# Patient Record
Sex: Female | Born: 1945
Health system: Southern US, Community
[De-identification: ages and names within clinical notes are randomized; demographics above are authoritative.]

## PROBLEM LIST (undated history)

## (undated) DIAGNOSIS — J189 Pneumonia, unspecified organism: Secondary | ICD-10-CM

## (undated) DIAGNOSIS — I10 Essential (primary) hypertension: Secondary | ICD-10-CM

## (undated) DIAGNOSIS — K5790 Diverticulosis of intestine, part unspecified, without perforation or abscess without bleeding: Secondary | ICD-10-CM

## (undated) DIAGNOSIS — G8929 Other chronic pain: Secondary | ICD-10-CM

## (undated) DIAGNOSIS — N12 Tubulo-interstitial nephritis, not specified as acute or chronic: Secondary | ICD-10-CM

## (undated) DIAGNOSIS — E039 Hypothyroidism, unspecified: Secondary | ICD-10-CM

## (undated) DIAGNOSIS — M1990 Unspecified osteoarthritis, unspecified site: Secondary | ICD-10-CM

## (undated) DIAGNOSIS — M549 Dorsalgia, unspecified: Secondary | ICD-10-CM

## (undated) DIAGNOSIS — N189 Chronic kidney disease, unspecified: Secondary | ICD-10-CM

## (undated) DIAGNOSIS — K219 Gastro-esophageal reflux disease without esophagitis: Secondary | ICD-10-CM

## (undated) DIAGNOSIS — R519 Headache, unspecified: Secondary | ICD-10-CM

## (undated) DIAGNOSIS — F429 Obsessive-compulsive disorder, unspecified: Secondary | ICD-10-CM

## (undated) DIAGNOSIS — F329 Major depressive disorder, single episode, unspecified: Secondary | ICD-10-CM

## (undated) DIAGNOSIS — H269 Unspecified cataract: Secondary | ICD-10-CM

## (undated) DIAGNOSIS — E785 Hyperlipidemia, unspecified: Secondary | ICD-10-CM

## (undated) DIAGNOSIS — F32A Depression, unspecified: Secondary | ICD-10-CM

## (undated) DIAGNOSIS — R51 Headache: Secondary | ICD-10-CM

## (undated) DIAGNOSIS — F419 Anxiety disorder, unspecified: Secondary | ICD-10-CM

## (undated) DIAGNOSIS — G43909 Migraine, unspecified, not intractable, without status migrainosus: Secondary | ICD-10-CM

## (undated) HISTORY — PX: HAMMER TOE SURGERY: SHX385

## (undated) HISTORY — PX: CATARACT EXTRACTION W/ INTRAOCULAR LENS  IMPLANT, BILATERAL: SHX1307

## (undated) HISTORY — DX: Anxiety disorder, unspecified: F41.9

## (undated) HISTORY — PX: KNEE ARTHROSCOPY: SUR90

## (undated) HISTORY — DX: Unspecified cataract: H26.9

## (undated) HISTORY — DX: Hyperlipidemia, unspecified: E78.5

## (undated) HISTORY — DX: Chronic kidney disease, unspecified: N18.9

## (undated) HISTORY — DX: Gastro-esophageal reflux disease without esophagitis: K21.9

## (undated) HISTORY — PX: DILATION AND CURETTAGE OF UTERUS: SHX78

---
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
## 1984-12-22 HISTORY — PX: ABDOMINAL HYSTERECTOMY: SHX81

## 1991-12-23 HISTORY — PX: OOPHORECTOMY: SHX86

## 1999-06-28 ENCOUNTER — Emergency Department (HOSPITAL_COMMUNITY): Admission: EM | Admit: 1999-06-28 | Discharge: 1999-06-28 | Payer: Self-pay | Admitting: Emergency Medicine

## 1999-06-28 ENCOUNTER — Encounter: Payer: Self-pay | Admitting: Emergency Medicine

## 1999-09-27 ENCOUNTER — Other Ambulatory Visit: Admission: RE | Admit: 1999-09-27 | Discharge: 1999-09-27 | Payer: Self-pay | Admitting: Obstetrics and Gynecology

## 2001-03-05 ENCOUNTER — Other Ambulatory Visit: Admission: RE | Admit: 2001-03-05 | Discharge: 2001-03-05 | Payer: Self-pay | Admitting: Obstetrics and Gynecology

## 2002-06-06 ENCOUNTER — Other Ambulatory Visit: Admission: RE | Admit: 2002-06-06 | Discharge: 2002-06-06 | Payer: Self-pay | Admitting: Obstetrics and Gynecology

## 2003-06-08 ENCOUNTER — Other Ambulatory Visit: Admission: RE | Admit: 2003-06-08 | Discharge: 2003-06-08 | Payer: Self-pay | Admitting: Obstetrics and Gynecology

## 2005-01-14 ENCOUNTER — Ambulatory Visit: Payer: Self-pay | Admitting: Family Medicine

## 2005-04-18 ENCOUNTER — Ambulatory Visit: Payer: Self-pay | Admitting: Family Medicine

## 2005-04-21 ENCOUNTER — Ambulatory Visit: Payer: Self-pay | Admitting: Family Medicine

## 2005-04-29 ENCOUNTER — Ambulatory Visit: Payer: Self-pay | Admitting: Family Medicine

## 2005-07-01 ENCOUNTER — Ambulatory Visit: Payer: Self-pay | Admitting: Emergency Medicine

## 2005-09-24 ENCOUNTER — Emergency Department (HOSPITAL_COMMUNITY): Admission: EM | Admit: 2005-09-24 | Discharge: 2005-09-24 | Payer: Self-pay | Admitting: Emergency Medicine

## 2005-10-09 ENCOUNTER — Ambulatory Visit: Payer: Self-pay | Admitting: Family Medicine

## 2005-12-24 ENCOUNTER — Ambulatory Visit: Payer: Self-pay | Admitting: Internal Medicine

## 2006-09-07 ENCOUNTER — Ambulatory Visit: Payer: Self-pay | Admitting: Family Medicine

## 2006-12-24 ENCOUNTER — Ambulatory Visit: Payer: Self-pay | Admitting: Family Medicine

## 2006-12-24 LAB — CONVERTED CEMR LAB
ALT: 14 units/L (ref 0–40)
AST: 19 units/L (ref 0–37)
Albumin: 4.1 g/dL (ref 3.5–5.2)
Alkaline Phosphatase: 55 units/L (ref 39–117)
BUN: 11 mg/dL (ref 6–23)
Basophils Absolute: 0 10*3/uL (ref 0.0–0.1)
Basophils Relative: 0.7 % (ref 0.0–1.0)
CO2: 28 meq/L (ref 19–32)
Calcium: 9.2 mg/dL (ref 8.4–10.5)
Chloride: 101 meq/L (ref 96–112)
Chol/HDL Ratio, serum: 2.8
Cholesterol: 201 mg/dL (ref 0–200)
Creatinine, Ser: 0.8 mg/dL (ref 0.4–1.2)
Eosinophil percent: 0.9 % (ref 0.0–5.0)
Free T4: 1 ng/dL (ref 0.6–1.6)
GFR calc non Af Amer: 78 mL/min
Glomerular Filtration Rate, Af Am: 94 mL/min/{1.73_m2}
Glucose, Bld: 89 mg/dL (ref 70–99)
HCT: 41.7 % (ref 36.0–46.0)
HDL: 71.6 mg/dL (ref >39.0)
Hemoglobin: 14 g/dL (ref 12.0–15.0)
LDL DIRECT: 101 mg/dL
Lymphocytes Relative: 21.7 % (ref 12.0–46.0)
MCHC: 33.5 g/dL (ref 30.0–36.0)
MCV: 89.7 fL (ref 78.0–100.0)
Monocytes Absolute: 0.3 10*3/uL (ref 0.2–0.7)
Monocytes Relative: 4.3 % (ref 3.0–11.0)
Neutro Abs: 4.5 10*3/uL (ref 1.4–7.7)
Neutrophils Relative %: 72.4 % (ref 43.0–77.0)
Platelets: 317 10*3/uL (ref 150–400)
Potassium: 3.7 meq/L (ref 3.5–5.1)
RBC: 4.65 M/uL (ref 3.87–5.11)
RDW: 11.9 % (ref 11.5–14.6)
Sodium: 138 meq/L (ref 135–145)
T3, Free: 3 pg/mL (ref 2.3–4.2)
TSH: 0.16 microintl units/mL — ABNORMAL LOW (ref 0.35–5.50)
Total Bilirubin: 0.5 mg/dL (ref 0.3–1.2)
Total Protein: 6.5 g/dL (ref 6.0–8.3)
Triglyceride fasting, serum: 117 mg/dL (ref 0–149)
VLDL: 23 mg/dL (ref 0–40)
WBC: 6.2 10*3/uL (ref 4.5–10.5)

## 2008-01-20 ENCOUNTER — Encounter: Admission: RE | Admit: 2008-01-20 | Discharge: 2008-01-20 | Payer: Self-pay | Admitting: Neurosurgery

## 2009-12-22 HISTORY — PX: COLONOSCOPY: SHX174

## 2010-06-26 ENCOUNTER — Encounter (INDEPENDENT_AMBULATORY_CARE_PROVIDER_SITE_OTHER): Payer: Self-pay | Admitting: *Deleted

## 2010-08-08 ENCOUNTER — Encounter (INDEPENDENT_AMBULATORY_CARE_PROVIDER_SITE_OTHER): Payer: Self-pay | Admitting: *Deleted

## 2010-08-13 ENCOUNTER — Ambulatory Visit: Payer: Self-pay | Admitting: Gastroenterology

## 2010-08-20 ENCOUNTER — Ambulatory Visit: Payer: Self-pay | Admitting: Gastroenterology

## 2010-12-22 HISTORY — PX: BUNIONECTOMY WITH HAMMERTOE RECONSTRUCTION: SHX5600

## 2011-01-21 NOTE — Miscellaneous (Signed)
Summary: [facility] Previsit/prep  Clinical Lists Changes  Medications: Added new medication of MOVIPREP 100 GM  SOLR (PEG-KCL-NACL-NASULF-NA ASC-C) As per prep instructions. - Signed Rx of MOVIPREP 100 GM  SOLR (PEG-KCL-NACL-NASULF-NA ASC-C) As per prep instructions.;  #1 x 0;  Signed;  Entered by: Wyona Almas RN;  Authorized by: Meryl Dare MD [facility];  Method used: [facility] [id number]*, [address], [place], [place], [place]  [address], Ph: [phone], Fax: [phone] Allergies: Added new allergy or adverse reaction of SULFA Added new allergy or adverse reaction of ASA Observations: Added new observation of NKA: F (08/13/2010 12:43)    Prescriptions: MOVIPREP 100 GM  SOLR (PEG-KCL-NACL-NASULF-NA ASC-C) As per prep instructions.  #1 x 0   Entered by:   Wyona Almas RN   Authorized by:   Meryl Dare MD [facility]   Signed by:   Wyona Almas RN on 08/13/2010   Method used:   Electronically to        [facility] [phone]* (retail)       [address]       [place], [place]  [address]       Ph: [phone]       Fax: [phone]   RxID:   [id number]

## 2011-01-21 NOTE — Letter (Signed)
Summary: [facility] Instructions  [facility]  [address] [place], [place] [address]   Phone: [phone]  Fax: [phone]       [name]    Aug 17, 1946    MRN: [id number]        Procedure Day Dorna Bloom:  Jake Shark  08/20/10     Arrival Time:  7:30AM     Procedure Time:  8:30AM     Location of Procedure:                    _ X_  [facility] (4th Floor)                 PREPARATION FOR COLONOSCOPY WITH MOVIPREP   Starting 5 days prior to your procedure 08/15/10 do not eat nuts, seeds, popcorn, corn, beans, peas,  salads, or any raw vegetables.  Do not take any fiber supplements (e.g. Metamucil, Citrucel, and Benefiber).  THE DAY BEFORE YOUR PROCEDURE         DATE: 08/19/10  DAY: MONDAY  1.  Drink clear liquids the entire day-NO SOLID FOOD  2.  Do not drink anything colored red or purple.  Avoid juices with pulp.  No orange juice.  3.  Drink at least 64 oz. (8 glasses) of fluid/clear liquids during the day to prevent dehydration and help the prep work efficiently.  CLEAR LIQUIDS INCLUDE: Water Jello Ice Popsicles Tea (sugar ok, no milk/cream) Powdered fruit flavored drinks Coffee (sugar ok, no milk/cream) Gatorade Juice: apple, white grape, white cranberry  Lemonade Clear bullion, consomm, broth Carbonated beverages (any kind) Strained chicken noodle soup Hard Candy                             4.  In the morning, mix first dose of MoviPrep solution:    Empty 1 Pouch A and 1 Pouch B into the disposable container    Add lukewarm drinking water to the top line of the container. Mix to dissolve    Refrigerate (mixed solution should be used within 24 hrs)  5.  Begin drinking the prep at 5:00 p.m. The MoviPrep container is divided by 4 marks.   Every 15 minutes drink the solution down to the next mark (approximately 8 oz) until the full liter is complete.   6.  Follow completed prep with 16 oz of clear liquid of your choice (Nothing red or  purple).  Continue to drink clear liquids until bedtime.  7.  Before going to bed, mix second dose of MoviPrep solution:    Empty 1 Pouch A and 1 Pouch B into the disposable container    Add lukewarm drinking water to the top line of the container. Mix to dissolve    Refrigerate  THE DAY OF YOUR PROCEDURE      DATE: 08/20/10  DAY: TUESDAY  Beginning at 3:30AM (5 hours before procedure):         1. Every 15 minutes, drink the solution down to the next mark (approx 8 oz) until the full liter is complete.  2. Follow completed prep with 16 oz. of clear liquid of your choice.    3. You may drink clear liquids until 6:30AM (2 HOURS BEFORE PROCEDURE).   MEDICATION INSTRUCTIONS  Unless otherwise instructed, you should take regular prescription medications with a small sip of water   as early as possible the morning of your procedure.  OTHER INSTRUCTIONS  You will need a responsible adult at least 65 years of age to accompany you and drive you home.   This person must remain in the waiting room during your procedure.  Wear loose fitting clothing that is easily removed.  Leave jewelry and other valuables at home.  However, you may wish to bring a book to read or  an iPod/MP3 player to listen to music as you wait for your procedure to start.  Remove all body piercing jewelry and leave at home.  Total time from sign-in until discharge is approximately 2-3 hours.  You should go home directly after your procedure and rest.  You can resume normal activities the  day after your procedure.  The day of your procedure you should not:   Drive   Make legal decisions   Operate machinery   Drink alcohol   Return to work  You will receive specific instructions about eating, activities and medications before you leave.    The above instructions have been reviewed and explained to me by  Wyona Almas RN  August 13, 2010 1:18 PM     I fully understand and can verbalize  these instructions _____________________________ Date _________

## 2011-01-21 NOTE — Letter (Signed)
Summary: Previsit letter  [facility]  [address] [place], [place] [address]   Phone: [phone]  Fax: [phone]       06/26/2010 MRN: [id number]  [name] [address] [place], [place]  [address]  Dear [name],  Welcome to the Gastroenterology Division at [facility].    You are scheduled to see a nurse for your pre-procedure visit on 08/13/2010 at 1:00pm on the 3rd floor at [facility], [address]  We ask that you try to arrive at our office 15 minutes prior to your appointment time to allow for check-in.  Your nurse visit will consist of discussing your medical and surgical history, your immediate family medical history, and your medications.    Please bring a complete list of all your medications or, if you prefer, bring the medication bottles and we will list them.  We will need to be aware of both prescribed and over the counter drugs.  We will need to know exact dosage information as well.  If you are on blood thinners (Coumadin, Plavix, Aggrenox, Ticlid, etc.) please call our office today/prior to your appointment, as we need to consult with your physician about holding your medication.   Please be prepared to read and sign documents such as consent forms, a financial agreement, and acknowledgement forms.  If necessary, and with your consent, a friend or relative is welcome to sit-in on the nurse visit with you.  Please bring your insurance card so that we may make a copy of it.  If your insurance requires a referral to see a specialist, please bring your referral form from your primary care physician.  No co-pay is required for this nurse visit.     If you cannot keep your appointment, please call [phone] to cancel or reschedule prior to your appointment date.  This allows [place] the opportunity to schedule an appointment for another patient in need of care.    Thank you for choosing [facility] for your medical needs.   We appreciate the opportunity to care for you.  Please visit [place] at our website  to learn more about our practice.                     Sincerely.                                                                                                                   The Gastroenterology Division

## 2011-01-21 NOTE — Procedures (Signed)
Summary: Colonoscopy  Patient: [name] Note: All result statuses are Final unless otherwise noted.  Tests: (1) Colonoscopy (COL)   COL Colonoscopy           DONE     [facility]     [address]     [place], [place]  [address]           COLONOSCOPY PROCEDURE REPORT     PATIENT:  [name], [name]  MR#:  [id number]     BIRTHDATE:  1946-02-15, 64 yrs. old  GENDER:  female     ENDOSCOPIST:  Judie Petit T. Russella Dar, MD, [facility]           PROCEDURE DATE:  08/20/2010     PROCEDURE:  Colonoscopy [address]     ASA CLASS:  Class II     INDICATIONS:  1) Routine Risk Screening     MEDICATIONS:   Fentanyl 100 mcg IV, Versed 8 mg IV     DESCRIPTION OF PROCEDURE:   After the risks benefits and     alternatives of the procedure were thoroughly explained, informed     consent was obtained.  Digital rectal exam was performed and     revealed no abnormalities.   The LB [id number]-[id number] endoscope     was introduced through the anus and advanced to the cecum, which     was identified by both the appendix and ileocecal valve, without     limitations.  The quality of the prep was good, using MoviPrep.     The instrument was then slowly withdrawn as the colon was fully     examined.     <<PROCEDUREIMAGES>>     FINDINGS:  Moderate diverticulosis was found in the sigmoid to     descending colon. A normal appearing cecum, ileocecal valve, and     appendiceal orifice were identified. The ascending, hepatic     flexure, transverse, splenic flexure, and rectum appeared     unremarkable. Retroflexed views in the rectum revealed no     abnormalities. The time to cecum =  4.5  minutes. The scope was     then withdrawn (time =  8.5  min) from the patient and the     procedure completed.     COMPLICATIONS:  None           ENDOSCOPIC IMPRESSION:     1) Moderate diverticulosis in the sigmoid to descending colon     segments     RECOMMENDATIONS:     1) High fiber diet with liberal fluid intake.     2)  Continue current colorectal screening recommendations for     "routine risk" patients with a repeat colonoscopy in 10 years.           Venita Lick. Russella Dar, MD, [facility]           CC: Laurann Montana, MD           n.     Rosalie DoctorVenita Lick. Stark at 08/20/2010 09:03 AM           Cathlyn Parsons, [id number]  Note: An exclamation mark (!) indicates a result that was not dispersed into the flowsheet. Document Creation Date: 08/20/2010 9:05 AM _______________________________________________________________________  (1) Order result status: Final Collection or observation date-time: 08/20/2010 08:55 Requested date-time:  Receipt date-time:  Reported date-time:  Referring Physician:   Ordering Physician: Claudette Head [phone]) Specimen Source:  Source: Launa Grill Order Number: [id number] Lab site:  Appended Document: Colonoscopy    Clinical Lists Changes  Observations: Added new observation of COLONNXTDUE: 07/2020 (08/20/2010 12:19)

## 2012-05-03 ENCOUNTER — Other Ambulatory Visit: Payer: Self-pay | Admitting: Family Medicine

## 2012-05-12 ENCOUNTER — Encounter (HOSPITAL_COMMUNITY)
Admission: RE | Admit: 2012-05-12 | Discharge: 2012-05-12 | Disposition: A | Discharge status: Home / Self Care | Payer: Medicare Other | Source: Ambulatory Visit | Attending: Family Medicine | Admitting: Family Medicine

## 2012-05-12 ENCOUNTER — Encounter (HOSPITAL_COMMUNITY): Payer: Self-pay

## 2012-05-12 DIAGNOSIS — M810 Age-related osteoporosis without current pathological fracture: Secondary | ICD-10-CM | POA: Insufficient documentation

## 2012-05-12 HISTORY — DX: Diverticulosis of intestine, part unspecified, without perforation or abscess without bleeding: K57.90

## 2012-05-12 HISTORY — DX: Unspecified osteoarthritis, unspecified site: M19.90

## 2012-05-12 HISTORY — DX: Hypothyroidism, unspecified: E03.9

## 2012-05-12 HISTORY — DX: Obsessive-compulsive disorder, unspecified: F42.9

## 2012-05-12 MED ORDER — SODIUM CHLORIDE 0.9 % IV SOLN
Freq: Once | INTRAVENOUS | Status: AC
  Administered 2012-05-12: 15:00:00 via INTRAVENOUS

## 2012-05-12 MED ORDER — ZOLEDRONIC ACID 5 MG/100ML IV SOLN
5.0000 mg | Freq: Once | INTRAVENOUS | Status: AC
  Administered 2012-05-12: 5 mg via INTRAVENOUS
  Filled 2012-05-12: qty 100

## 2012-05-12 NOTE — Discharge Instructions (Signed)
Drink plenty of water the next few days. If any aches and pains take a pain reliever you normally would take. Continue taking your Calcium and Vitamin D as prescribed by your MD.     RECLAST Zoledronic Acid injection (Paget's Disease, Osteoporosis)  What is this medicine?  ZOLEDRONIC ACID (ZOE le dron ik AS id) lowers the amount of calcium loss from bone. It is used to treat Paget's disease and osteoporosis in women.  This medicine may be used for other purposes; ask your health care provider or pharmacist if you have questions.  What should I tell my health care provider before I take this medicine?  They need to know if you have any of these conditions:  -aspirin-sensitive asthma  -dental disease  -kidney disease  -low levels of calcium in the blood  -past surgery on the parathyroid gland or intestines  -an unusual or allergic reaction to zoledronic acid, other medicines, foods, dyes, or preservatives  -pregnant or trying to get pregnant  -breast-feeding  How should I use this medicine?  This medicine is for infusion into a vein. It is given by a health care professional in a hospital or clinic setting.  Talk to your pediatrician regarding the use of this medicine in children. This medicine is not approved for use in children.  Overdosage: If you think you have taken too much of this medicine contact a poison control center or emergency room at once.  NOTE: This medicine is only for you. Do not share this medicine with others.  What if I miss a dose?  It is important not to miss your dose. Call your doctor or health care professional if you are unable to keep an appointment.  What may interact with this medicine?  -certain antibiotics given by injection  -NSAIDs, medicines for pain and inflammation, like ibuprofen or naproxen  -some diuretics like bumetanide, furosemide  -teriparatide  This list may not describe all possible interactions. Give your health care provider a list of all  the medicines, herbs, non-prescription drugs, or dietary supplements you use. Also tell them if you smoke, drink alcohol, or use illegal drugs. Some items may interact with your medicine.  What should I watch for while using this medicine?  Visit your doctor or health care professional for regular checkups. It may be some time before you see the benefit from this medicine. Do not stop taking your medicine unless your doctor tells you to. Your doctor may order blood tests or other tests to see how you are doing.  Women should inform their doctor if they wish to become pregnant or think they might be pregnant. There is a potential for serious side effects to an unborn child. Talk to your health care professional or pharmacist for more information.  You should make sure that you get enough calcium and vitamin D while you are taking this medicine. Discuss the foods you eat and the vitamins you take with your health care professional.  Some people who take this medicine have severe bone, joint, and/or muscle pain. This medicine may also increase your risk for a broken thigh bone. Tell your doctor right away if you have pain in your upper leg or groin. Tell your doctor if you have any pain that does not go away or that gets worse.  What side effects may I notice from receiving this medicine?  Side effects that you should report to your doctor or health care professional as soon as possible:  -allergic reactions  like skin rash, itching or hives, swelling of the face, lips, or tongue  -breathing problems  -changes in vision  -feeling faint or lightheaded, falls  -jaw burning, cramping, or pain  -muscle cramps, stiffness, or weakness  -trouble passing urine or change in the amount of urine  Side effects that usually do not require medical attention (report to your doctor or health care professional if they continue or are bothersome):  -bone, joint, or muscle pain  -fever  -irritation at site where injected   -loss of appetite  -nausea, vomiting  -stomach upset  -tired  This list may not describe all possible side effects. Call your doctor for medical advice about side effects. You may report side effects to FDA at 1-800-FDA-1088.  Where should I keep my medicine?  This drug is given in a hospital or clinic and will not be stored at home.  NOTE: This sheet is a summary. It may not cover all possible information. If you have questions about this medicine, talk to your doctor, pharmacist, or health care provider.   2012, [employer]. (06/06/2011 9:08:15 AM)

## 2012-05-25 ENCOUNTER — Encounter (HOSPITAL_COMMUNITY): Payer: Self-pay | Admitting: Pharmacy Technician

## 2012-06-01 ENCOUNTER — Encounter (HOSPITAL_COMMUNITY): Payer: Self-pay

## 2012-06-01 ENCOUNTER — Encounter (HOSPITAL_COMMUNITY)
Admission: RE | Admit: 2012-06-01 | Discharge: 2012-06-01 | Disposition: A | Discharge status: Home / Self Care | Payer: Medicare Other | Source: Ambulatory Visit | Attending: Orthopedic Surgery | Admitting: Orthopedic Surgery

## 2012-06-01 LAB — BASIC METABOLIC PANEL
BUN: 11 mg/dL (ref 6–23)
CO2: 27 mEq/L (ref 19–32)
Calcium: 8.8 mg/dL (ref 8.4–10.5)
Chloride: 102 mEq/L (ref 96–112)
Creatinine, Ser: 0.81 mg/dL (ref 0.50–1.10)
GFR calc Af Amer: 86 mL/min — ABNORMAL LOW (ref >90)
GFR calc non Af Amer: 75 mL/min — ABNORMAL LOW (ref >90)
Glucose, Bld: 113 mg/dL — ABNORMAL HIGH (ref 70–99)
Potassium: 3.2 mEq/L — ABNORMAL LOW (ref 3.5–5.1)
Sodium: 138 mEq/L (ref 135–145)

## 2012-06-01 LAB — URINALYSIS, ROUTINE W REFLEX MICROSCOPIC
Bilirubin Urine: NEGATIVE
Hgb urine dipstick: NEGATIVE
Specific Gravity, Urine: 1.012 (ref 1.005–1.030)
pH: 5.5 (ref 5.0–8.0)

## 2012-06-01 LAB — PROTIME-INR: Prothrombin Time: 13.3 seconds (ref 11.6–15.2)

## 2012-06-01 LAB — CBC
HCT: 40.3 % (ref 36.0–46.0)
Hemoglobin: 13.7 g/dL (ref 12.0–15.0)
MCHC: 34 g/dL (ref 30.0–36.0)
RDW: 13 % (ref 11.5–15.5)
WBC: 6.8 10*3/uL (ref 4.0–10.5)

## 2012-06-01 LAB — DIFFERENTIAL
Lymphocytes Relative: 24 % (ref 12–46)
Lymphs Abs: 1.6 10*3/uL (ref 0.7–4.0)
Monocytes Absolute: 0.4 10*3/uL (ref 0.1–1.0)
Monocytes Relative: 6 % (ref 3–12)
Neutro Abs: 4.5 10*3/uL (ref 1.7–7.7)

## 2012-06-01 LAB — SURGICAL PCR SCREEN: Staphylococcus aureus: POSITIVE — AB

## 2012-06-01 LAB — APTT: aPTT: 28 seconds (ref 24–37)

## 2012-06-01 NOTE — Pre-Procedure Instructions (Signed)
06/01/12 Abnormal potassium result of 3.2 called to office and spoke with Natalia Leatherwood , nurse of Dr Charlann Boxer.

## 2012-06-01 NOTE — Patient Instructions (Addendum)
20 [name]  06/01/2012   Your procedure is scheduled on:  06/07/12 1105am-1235pm  Report to [facility] at 0830 AM.  Call this number if you have problems the morning of surgery: [phone]   Remember:   Do not eat food:After Midnight.  May have clear liquids:until Midnight . Marland Kitchen  Take these medicines the morning of surgery with A SIP OF WATER:    Do not wear jewelry, make-up or nail polish.  Do not wear lotions, powders, or perfumes  Do not shave 48 hours prior to surgery.   Do not bring valuables to the hospital.  Contacts, dentures or bridgework may not be worn into surgery.  Leave suitcase in the car. After surgery it may be brought to your room.  For patients admitted to the hospital, checkout time is 11:00 AM the day of discharge.    Special Instructions: CHG Shower Use Special Wash: 1/2 bottle night before surgery and 1/2 bottle morning of surgery. shower chin to toes with CHG.  Wash face and private parts with regular soap.    Please read over the following fact sheets that you were given: MRSA Information, coughing and deep breathing exercises, leg exercises, Blood Transfusion Fact sheet, Incentive Spirometry Fact Sheet

## 2012-06-02 NOTE — Pre-Procedure Instructions (Signed)
06/01/12 Teach Back method used for preop appointment.  

## 2012-06-02 NOTE — Progress Notes (Signed)
H&P dictated 06/02/12 Dictation # [id number]

## 2012-06-03 NOTE — H&P (Signed)
NAME:  [name], [name]               ACCOUNT NO.:  [id number]  MEDICAL RECORD NO.:  [id number]  LOCATION:  [facility]                         FACILITY:  [facility]  PHYSICIAN:  Valinda Fedie J. Audryna Wendt, P.A.DATE OF BIRTH:  12/03/1946  DATE OF ADMISSION:  06/01/2012 DATE OF DISCHARGE:  06/01/2012                             HISTORY & PHYSICAL   DATE OF SURGERY:  June 07, 2012.  ADMITTING DIAGNOSIS:  Medial compartment arthritis, right knee.  HISTORY OF PRESENT ILLNESS:  This is a 65-year-old lady with a history of previous knee arthroscopy with medial compartment degeneration, who is failed conservative management.  She is now scheduled for medial right knee unicompartmental arthroplasty.  The surgery risks, benefits, and aftercare were discussed with the patient.  Questions invited and answered.  Note that she is a candidate for trans-cinnamic acid, but is not a candidate for dexamethasone.  She is given her home medicines of aspirin, Robaxin, iron, MiraLAX, and Colace.  Her medical doctor is Dr. Cynthia White, and again she plans on going home after surgery.  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  CURRENT MEDICATIONS: 1. Synthroid 88 mcg once a day. 2. Venlafaxine 0.75 mg 1 daily. 3. Vitamin A. 4. Vitamin C. 5. Vitamin D. 6. Tylenol PM p.m. 1 daily. 7. Hydrocodone 5/325 p.r.n. 8. Estradiol 0.5 mg 1 tablet daily for 3 weeks, then off a week, and     then repeat. 9. Red yeast rice.  SERIOUS MEDICAL ILLNESSES:  Include OCD, hypothyroidism.  PREVIOUS SURGERIES:  Include partial hysterectomy, hammertoe surgery, and knee arthroscopy.  FAMILY HISTORY:  Positive for stroke, diabetes, heart disease, and cancer.  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  She does not smoke and does not drink, and again plans on going home after surgery.  REVIEW OF SYSTEMS:  CENTRAL NERVOUS SYSTEM:  Positive for migraines. PULMONARY:   Negative for shortness breath, PND, and orthopnea. CARDIOVASCULAR:  Negative for chest pain, palpitation.  GI:  Negative for ulcers, hepatitis.  GU:  Negative for urinary tract difficulty. MUSCULOSKELETAL:  Positive as in the HPI.  PHYSICAL EXAM:  VITAL SIGNS:  BP 146/86, pulse 72 and regular, respirations 14. HEENT:  Head normocephalic.  Nose patent.  Ears patent.  Pupils are equal, round, and reactive to light.  Throat without injection. NECK:  Supple without adenopathy.  Carotids 2+ without bruit. CHEST:  Clear to auscultation.  No rales or rhonchi.  Respirations 14. HEART:  Regular rate and rhythm at 76 beats per minute without murmur. ABDOMEN:  Soft.  Active bowel sounds.  No masses or organomegaly. NEUROLOGIC:  The patient is alert and oriented to time, place, and person.  Cranial nerves II-XII are grossly intact. EXTREMITIES:  Shows the right knee with a varus deformity.  Tenderness of the medial joint line.  Full extension and further flexion to 120 degrees.  Sensation and circulation are intact.  IMPRESSION:  Medial compartment arthritis, right knee.  PLAN:  Medial unicompartmental arthroplasty, right knee.     Farris Geiman J. Triton Heidrich, P.A.     SJC/MEDQ  D:  06/02/2012  T:  06/03/2012  Job:  [id number] 

## 2012-06-07 ENCOUNTER — Encounter (HOSPITAL_COMMUNITY): Payer: Self-pay | Admitting: Anesthesiology

## 2012-06-07 ENCOUNTER — Encounter (HOSPITAL_COMMUNITY)
Admission: RE | Disposition: A | Discharge status: Home Health | Payer: Self-pay | Source: Ambulatory Visit | Attending: Orthopedic Surgery

## 2012-06-07 ENCOUNTER — Other Ambulatory Visit: Payer: Self-pay

## 2012-06-07 ENCOUNTER — Inpatient Hospital Stay (HOSPITAL_COMMUNITY)
Admission: RE | Admit: 2012-06-07 | Discharge: 2012-06-08 | DRG: 470 | Disposition: A | Discharge status: Home Health | Payer: Medicare Other | Source: Ambulatory Visit | Attending: Orthopedic Surgery | Admitting: Orthopedic Surgery

## 2012-06-07 ENCOUNTER — Encounter (HOSPITAL_COMMUNITY): Payer: Self-pay | Admitting: *Deleted

## 2012-06-07 ENCOUNTER — Ambulatory Visit (HOSPITAL_COMMUNITY): Payer: Medicare Other | Admitting: Anesthesiology

## 2012-06-07 DIAGNOSIS — Z96651 Presence of right artificial knee joint: Secondary | ICD-10-CM

## 2012-06-07 DIAGNOSIS — E039 Hypothyroidism, unspecified: Secondary | ICD-10-CM | POA: Diagnosis present

## 2012-06-07 DIAGNOSIS — M1710 Unilateral primary osteoarthritis, unspecified knee: Principal | ICD-10-CM | POA: Diagnosis present

## 2012-06-07 DIAGNOSIS — F429 Obsessive-compulsive disorder, unspecified: Secondary | ICD-10-CM | POA: Diagnosis present

## 2012-06-07 DIAGNOSIS — Z01812 Encounter for preprocedural laboratory examination: Secondary | ICD-10-CM

## 2012-06-07 HISTORY — PX: PARTIAL KNEE ARTHROPLASTY: SHX2174

## 2012-06-07 LAB — TYPE AND SCREEN
ABO/RH(D): B NEG
Antibody Screen: NEGATIVE

## 2012-06-07 SURGERY — ARTHROPLASTY, KNEE, UNICOMPARTMENTAL
Anesthesia: Spinal | Site: Knee | Laterality: Right | Wound class: Clean

## 2012-06-07 MED ORDER — KETAMINE HCL 10 MG/ML IJ SOLN
INTRAMUSCULAR | Status: DC | PRN
  Administered 2012-06-07 (×12): 2.5 mg via INTRAVENOUS
  Administered 2012-06-07: 10 mg via INTRAVENOUS
  Administered 2012-06-07 (×4): 2.5 mg via INTRAVENOUS

## 2012-06-07 MED ORDER — PROPOFOL 10 MG/ML IV EMUL
INTRAVENOUS | Status: DC | PRN
  Administered 2012-06-07: 300 ug/kg/min via INTRAVENOUS

## 2012-06-07 MED ORDER — LIDOCAINE HCL (CARDIAC) 20 MG/ML IV SOLN
INTRAVENOUS | Status: DC | PRN
  Administered 2012-06-07: 50 mg via INTRAVENOUS

## 2012-06-07 MED ORDER — CEFAZOLIN SODIUM 1-5 GM-% IV SOLN
1.0000 g | INTRAVENOUS | Status: AC
  Administered 2012-06-07 (×2): 1 g via INTRAVENOUS

## 2012-06-07 MED ORDER — PHENOL 1.4 % MT LIQD
1.0000 | OROMUCOSAL | Status: DC | PRN
  Filled 2012-06-07: qty 177

## 2012-06-07 MED ORDER — HYDROMORPHONE HCL PF 1 MG/ML IJ SOLN
0.5000 mg | INTRAMUSCULAR | Status: DC | PRN
  Administered 2012-06-08: 1 mg via INTRAVENOUS
  Filled 2012-06-07: qty 1

## 2012-06-07 MED ORDER — ZOLPIDEM TARTRATE 5 MG PO TABS
5.0000 mg | ORAL_TABLET | Freq: Every evening | ORAL | Status: DC | PRN
  Administered 2012-06-07: 5 mg via ORAL
  Filled 2012-06-07: qty 1

## 2012-06-07 MED ORDER — CEFAZOLIN SODIUM 1-5 GM-% IV SOLN
1.0000 g | Freq: Four times a day (QID) | INTRAVENOUS | Status: AC
  Administered 2012-06-07 – 2012-06-08 (×2): 1 g via INTRAVENOUS
  Filled 2012-06-07 (×2): qty 50

## 2012-06-07 MED ORDER — ACETAMINOPHEN 10 MG/ML IV SOLN
INTRAVENOUS | Status: AC
  Filled 2012-06-07: qty 100

## 2012-06-07 MED ORDER — SODIUM CHLORIDE 0.9 % IV SOLN
INTRAVENOUS | Status: DC
  Administered 2012-06-07 – 2012-06-08 (×2): via INTRAVENOUS
  Filled 2012-06-07 (×5): qty 1000

## 2012-06-07 MED ORDER — PROMETHAZINE HCL 25 MG/ML IJ SOLN: 6.2500 mg | INTRAMUSCULAR | Status: DC | PRN

## 2012-06-07 MED ORDER — HYDROCODONE-ACETAMINOPHEN 7.5-325 MG PO TABS
1.0000 | ORAL_TABLET | ORAL | Status: DC
  Administered 2012-06-07 – 2012-06-08 (×3): 2 via ORAL
  Administered 2012-06-08: 1 via ORAL
  Administered 2012-06-08 (×2): 2 via ORAL
  Filled 2012-06-07 (×6): qty 2

## 2012-06-07 MED ORDER — MIDAZOLAM HCL 5 MG/5ML IJ SOLN
INTRAMUSCULAR | Status: DC | PRN
  Administered 2012-06-07: 2 mg via INTRAVENOUS

## 2012-06-07 MED ORDER — ONDANSETRON HCL 4 MG PO TABS: 4.0000 mg | ORAL_TABLET | Freq: Four times a day (QID) | ORAL | Status: DC | PRN

## 2012-06-07 MED ORDER — FENTANYL CITRATE 0.05 MG/ML IJ SOLN
INTRAMUSCULAR | Status: DC | PRN
  Administered 2012-06-07: 25 ug via INTRAVENOUS
  Administered 2012-06-07: 50 ug via INTRAVENOUS
  Administered 2012-06-07: 25 ug via INTRAVENOUS

## 2012-06-07 MED ORDER — FLEET ENEMA 7-19 GM/118ML RE ENEM: 1.0000 | ENEMA | Freq: Once | RECTAL | Status: AC | PRN

## 2012-06-07 MED ORDER — KETOROLAC TROMETHAMINE 30 MG/ML IJ SOLN
INTRAMUSCULAR | Status: DC | PRN
  Administered 2012-06-07: 30 mg via INTRAMUSCULAR

## 2012-06-07 MED ORDER — FERROUS SULFATE 325 (65 FE) MG PO TABS
325.0000 mg | ORAL_TABLET | Freq: Three times a day (TID) | ORAL | Status: DC
  Administered 2012-06-07 – 2012-06-08 (×2): 325 mg via ORAL
  Filled 2012-06-07 (×5): qty 1

## 2012-06-07 MED ORDER — DEXAMETHASONE SODIUM PHOSPHATE 10 MG/ML IJ SOLN
INTRAMUSCULAR | Status: DC | PRN
  Administered 2012-06-07: 10 mg via INTRAVENOUS

## 2012-06-07 MED ORDER — 0.9 % SODIUM CHLORIDE (POUR BTL) OPTIME
TOPICAL | Status: DC | PRN
  Administered 2012-06-07: 1000 mL

## 2012-06-07 MED ORDER — VENLAFAXINE HCL ER 75 MG PO CP24
75.0000 mg | ORAL_CAPSULE | Freq: Every day | ORAL | Status: DC
Start: 2012-06-07 — End: 2012-06-08
  Administered 2012-06-07 – 2012-06-08 (×2): 75 mg via ORAL
  Filled 2012-06-07 (×2): qty 1

## 2012-06-07 MED ORDER — ACETAMINOPHEN 10 MG/ML IV SOLN
INTRAVENOUS | Status: DC | PRN
  Administered 2012-06-07: 1000 mg via INTRAVENOUS

## 2012-06-07 MED ORDER — PHENYLEPHRINE HCL 10 MG/ML IJ SOLN
INTRAMUSCULAR | Status: DC | PRN
  Administered 2012-06-07: 80 ug via INTRAVENOUS
  Administered 2012-06-07 (×7): 20 ug via INTRAVENOUS

## 2012-06-07 MED ORDER — LACTATED RINGERS IV SOLN
INTRAVENOUS | Status: DC | PRN
  Administered 2012-06-07 (×3): via INTRAVENOUS

## 2012-06-07 MED ORDER — HYDROMORPHONE HCL PF 1 MG/ML IJ SOLN
INTRAMUSCULAR | Status: AC
  Filled 2012-06-07: qty 1

## 2012-06-07 MED ORDER — DEXTROSE 5 % IV SOLN
500.0000 mg | Freq: Four times a day (QID) | INTRAVENOUS | Status: DC | PRN
  Administered 2012-06-07: 500 mg via INTRAVENOUS
  Filled 2012-06-07: qty 5

## 2012-06-07 MED ORDER — ONDANSETRON HCL 4 MG/2ML IJ SOLN: 4.0000 mg | Freq: Four times a day (QID) | INTRAMUSCULAR | Status: DC | PRN

## 2012-06-07 MED ORDER — ALUM & MAG HYDROXIDE-SIMETH 200-200-20 MG/5ML PO SUSP: 30.0000 mL | ORAL | Status: DC | PRN

## 2012-06-07 MED ORDER — CEFAZOLIN SODIUM 1-5 GM-% IV SOLN
INTRAVENOUS | Status: AC
  Filled 2012-06-07: qty 50

## 2012-06-07 MED ORDER — METOCLOPRAMIDE HCL 10 MG PO TABS: 5.0000 mg | ORAL_TABLET | Freq: Three times a day (TID) | ORAL | Status: DC | PRN

## 2012-06-07 MED ORDER — ONDANSETRON HCL 4 MG/2ML IJ SOLN
INTRAMUSCULAR | Status: DC | PRN
  Administered 2012-06-07: 4 mg via INTRAVENOUS

## 2012-06-07 MED ORDER — RIVAROXABAN 10 MG PO TABS
10.0000 mg | ORAL_TABLET | ORAL | Status: DC
  Administered 2012-06-08: 10 mg via ORAL
  Filled 2012-06-07 (×2): qty 1

## 2012-06-07 MED ORDER — MENTHOL 3 MG MT LOZG
1.0000 | LOZENGE | OROMUCOSAL | Status: DC | PRN
  Filled 2012-06-07: qty 9

## 2012-06-07 MED ORDER — BUPIVACAINE-EPINEPHRINE 0.25% -1:200000 IJ SOLN
INTRAMUSCULAR | Status: DC | PRN
  Administered 2012-06-07: 30 mL

## 2012-06-07 MED ORDER — DOCUSATE SODIUM 100 MG PO CAPS
100.0000 mg | ORAL_CAPSULE | Freq: Two times a day (BID) | ORAL | Status: DC
  Administered 2012-06-07 – 2012-06-08 (×2): 100 mg via ORAL

## 2012-06-07 MED ORDER — METHOCARBAMOL 500 MG PO TABS
500.0000 mg | ORAL_TABLET | Freq: Four times a day (QID) | ORAL | Status: DC | PRN
  Administered 2012-06-07 – 2012-06-08 (×3): 500 mg via ORAL
  Filled 2012-06-07 (×3): qty 1

## 2012-06-07 MED ORDER — POLYETHYLENE GLYCOL 3350 17 G PO PACK
17.0000 g | PACK | Freq: Two times a day (BID) | ORAL | Status: DC
  Administered 2012-06-08: 17 g via ORAL

## 2012-06-07 MED ORDER — TRANEXAMIC ACID 100 MG/ML IV SOLN
1000.0000 mg | Freq: Once | INTRAVENOUS | Status: AC
  Administered 2012-06-07: 1000 mg via INTRAVENOUS
  Filled 2012-06-07: qty 10

## 2012-06-07 MED ORDER — DIPHENHYDRAMINE HCL 25 MG PO CAPS: 25.0000 mg | ORAL_CAPSULE | Freq: Four times a day (QID) | ORAL | Status: DC | PRN

## 2012-06-07 MED ORDER — HYDROMORPHONE HCL PF 1 MG/ML IJ SOLN
0.2500 mg | INTRAMUSCULAR | Status: DC | PRN
  Administered 2012-06-07 (×2): 0.5 mg via INTRAVENOUS

## 2012-06-07 MED ORDER — CHLORHEXIDINE GLUCONATE 4 % EX LIQD
60.0000 mL | Freq: Once | CUTANEOUS | Status: DC
  Filled 2012-06-07: qty 60

## 2012-06-07 MED ORDER — METOCLOPRAMIDE HCL 5 MG/ML IJ SOLN: 5.0000 mg | Freq: Three times a day (TID) | INTRAMUSCULAR | Status: DC | PRN

## 2012-06-07 MED ORDER — KETOROLAC TROMETHAMINE 30 MG/ML IJ SOLN
INTRAMUSCULAR | Status: AC
  Filled 2012-06-07: qty 1

## 2012-06-07 MED ORDER — BISACODYL 5 MG PO TBEC: 5.0000 mg | DELAYED_RELEASE_TABLET | Freq: Every day | ORAL | Status: DC | PRN

## 2012-06-07 MED ORDER — BUPIVACAINE-EPINEPHRINE PF 0.25-1:200000 % IJ SOLN
INTRAMUSCULAR | Status: AC
  Filled 2012-06-07: qty 30

## 2012-06-07 MED ORDER — LACTATED RINGERS IV SOLN
INTRAVENOUS | Status: DC
  Administered 2012-06-07: 1000 mL via INTRAVENOUS

## 2012-06-07 MED ORDER — LEVOTHYROXINE SODIUM 88 MCG PO TABS
88.0000 ug | ORAL_TABLET | Freq: Every day | ORAL | Status: DC
  Administered 2012-06-08: 88 ug via ORAL
  Filled 2012-06-07: qty 1

## 2012-06-07 SURGICAL SUPPLY — 48 items
BAG ZIPLOCK 12X15 (MISCELLANEOUS) ×2 IMPLANT
BANDAGE ELASTIC 6 VELCRO ST LF (GAUZE/BANDAGES/DRESSINGS) ×2 IMPLANT
BANDAGE ESMARK 6X9 LF (GAUZE/BANDAGES/DRESSINGS) ×1 IMPLANT
BLADE SAW RECIPROCATING 77.5 (BLADE) ×2 IMPLANT
BLADE SAW SGTL 13.0X1.19X90.0M (BLADE) ×2 IMPLANT
BNDG ESMARK 6X9 LF (GAUZE/BANDAGES/DRESSINGS) ×2
BOWL SMART MIX CTS (DISPOSABLE) ×2 IMPLANT
CEMENT HV SMART SET (Cement) ×2 IMPLANT
CLOTH BEACON ORANGE TIMEOUT ST (SAFETY) ×2 IMPLANT
COVER SURGICAL LIGHT HANDLE (MISCELLANEOUS) ×2 IMPLANT
CUFF TOURN SGL QUICK 34 (TOURNIQUET CUFF) ×1
CUFF TRNQT CYL 34X4X40X1 (TOURNIQUET CUFF) ×1 IMPLANT
DERMABOND ADVANCED (GAUZE/BANDAGES/DRESSINGS) ×1
DERMABOND ADVANCED .7 DNX12 (GAUZE/BANDAGES/DRESSINGS) ×1 IMPLANT
DRAPE EXTREMITY T 121X128X90 (DRAPE) ×2 IMPLANT
DRAPE POUCH INSTRU U-SHP 10X18 (DRAPES) ×2 IMPLANT
DRSG AQUACEL AG ADV 3.5X 6 (GAUZE/BANDAGES/DRESSINGS) ×2 IMPLANT
DRSG TEGADERM 4X4.75 (GAUZE/BANDAGES/DRESSINGS) ×2 IMPLANT
DURAPREP 26ML APPLICATOR (WOUND CARE) ×2 IMPLANT
ELECT REM PT RETURN 9FT ADLT (ELECTROSURGICAL) ×2
ELECTRODE REM PT RTRN 9FT ADLT (ELECTROSURGICAL) ×1 IMPLANT
EVACUATOR 1/8 PVC DRAIN (DRAIN) ×2 IMPLANT
FACESHIELD LNG OPTICON STERILE (SAFETY) ×8 IMPLANT
GAUZE SPONGE 2X2 8PLY STRL LF (GAUZE/BANDAGES/DRESSINGS) ×1 IMPLANT
GLOVE BIOGEL PI IND STRL 7.5 (GLOVE) ×1 IMPLANT
GLOVE BIOGEL PI IND STRL 8 (GLOVE) IMPLANT
GLOVE BIOGEL PI INDICATOR 7.5 (GLOVE) ×1
GLOVE BIOGEL PI INDICATOR 8 (GLOVE)
GLOVE ORTHO TXT STRL SZ7.5 (GLOVE) ×4 IMPLANT
GOWN BRE IMP PREV XXLGXLNG (GOWN DISPOSABLE) ×4 IMPLANT
GOWN STRL NON-REIN LRG LVL3 (GOWN DISPOSABLE) ×2 IMPLANT
KIT BASIN OR (CUSTOM PROCEDURE TRAY) ×2 IMPLANT
LEGGING LITHOTOMY PAIR STRL (DRAPES) ×2 IMPLANT
MANIFOLD NEPTUNE II (INSTRUMENTS) ×2 IMPLANT
NDL SAFETY ECLIPSE 18X1.5 (NEEDLE) ×1 IMPLANT
NEEDLE HYPO 18GX1.5 SHARP (NEEDLE) ×1
PACK TOTAL JOINT (CUSTOM PROCEDURE TRAY) ×2 IMPLANT
POSITIONER SURGICAL ARM (MISCELLANEOUS) IMPLANT
SPONGE GAUZE 2X2 STER 10/PKG (GAUZE/BANDAGES/DRESSINGS) ×1
SUCTION FRAZIER TIP 10 FR DISP (SUCTIONS) ×2 IMPLANT
SUT MNCRL AB 4-0 PS2 18 (SUTURE) ×2 IMPLANT
SUT VIC AB 1 CT1 36 (SUTURE) ×2 IMPLANT
SUT VIC AB 2-0 CT1 27 (SUTURE) ×2
SUT VIC AB 2-0 CT1 TAPERPNT 27 (SUTURE) ×2 IMPLANT
SUT VLOC 180 0 24IN GS25 (SUTURE) ×2 IMPLANT
SYR 50ML LL SCALE MARK (SYRINGE) ×2 IMPLANT
TOWEL OR 17X26 10 PK STRL BLUE (TOWEL DISPOSABLE) ×4 IMPLANT
TRAY FOLEY CATH 14FRSI W/METER (CATHETERS) ×2 IMPLANT

## 2012-06-07 NOTE — Anesthesia Postprocedure Evaluation (Addendum)
  Anesthesia Post-op Note  Patient: [name]  Procedure(s) Performed: Procedure(s) (LRB): UNICOMPARTMENTAL KNEE (Right)  Patient Location: PACU  Anesthesia Type: General  Level of Consciousness: awake and alert   Airway and Oxygen Therapy: Patient Spontanous Breathing  Post-op Pain: mild  Post-op Assessment: Post-op Vital signs reviewed, Patient's Cardiovascular Status Stable, Respiratory Function Stable, Patent Airway and No signs of Nausea or vomiting  Post-op Vital Signs: stable  Complications: No apparent anesthesia complications. Complained of chest pain 5/10 briefly in PACU. Stat ECG was normal, and pain resolved very quickly. No SOB, no other symptoms. VSS. Dr. Charlann Boxer made aware. Patient to let nurse know if pain returns.

## 2012-06-07 NOTE — H&P (View-Only) (Signed)
[name], [name]               ACCOUNT NO.:  [id number]  MEDICAL RECORD NO.:  [id number]  LOCATION:  [facility]                         FACILITY:  [facility]  PHYSICIAN:  Jaquelyn Bitter. Sargent Mankey, P.A.DATE OF BIRTH:  December 28, 1945  DATE OF ADMISSION:  06/01/2012 DATE OF DISCHARGE:  06/01/2012                             HISTORY & PHYSICAL   DATE OF SURGERY:  June 07, 2012.  ADMITTING DIAGNOSIS:  Medial compartment arthritis, right knee.  HISTORY OF PRESENT ILLNESS:  This is a 66 year old lady with a history of previous knee arthroscopy with medial compartment degeneration, who is failed conservative management.  She is now scheduled for medial right knee unicompartmental arthroplasty.  The surgery risks, benefits, and aftercare were discussed with the patient.  Questions invited and answered.  Note that she is a candidate for trans-cinnamic acid, but is not a candidate for dexamethasone.  She is given her home medicines of aspirin, Robaxin, iron, MiraLAX, and Colace.  Her medical doctor is Dr. Laurann Montana, and again she plans on going home after surgery.  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  CURRENT MEDICATIONS: 1. Synthroid 88 mcg once a day. 2. Venlafaxine 0.75 mg 1 daily. 3. Vitamin A. 4. Vitamin C. 5. Vitamin D. 6. Tylenol PM p.m. 1 daily. 7. Hydrocodone 5/325 p.r.n. 8. Estradiol 0.5 mg 1 tablet daily for 3 weeks, then off a week, and     then repeat. 9. Red yeast rice.  SERIOUS MEDICAL ILLNESSES:  Include OCD, hypothyroidism.  PREVIOUS SURGERIES:  Include partial hysterectomy, hammertoe surgery, and knee arthroscopy.  FAMILY HISTORY:  Positive for stroke, diabetes, heart disease, and cancer.  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  She does not smoke and does not drink, and again plans on going home after surgery.  REVIEW OF SYSTEMS:  CENTRAL NERVOUS SYSTEM:  Positive for migraines. PULMONARY:   Negative for shortness breath, PND, and orthopnea. CARDIOVASCULAR:  Negative for chest pain, palpitation.  GI:  Negative for ulcers, hepatitis.  GU:  Negative for urinary tract difficulty. MUSCULOSKELETAL:  Positive as in the HPI.  PHYSICAL EXAM:  VITAL SIGNS:  BP 146/86, pulse 72 and regular, respirations 14. HEENT:  Head normocephalic.  Nose patent.  Ears patent.  Pupils are equal, round, and reactive to light.  Throat without injection. NECK:  Supple without adenopathy.  Carotids 2+ without bruit. CHEST:  Clear to auscultation.  No rales or rhonchi.  Respirations 14. HEART:  Regular rate and rhythm at 76 beats per minute without murmur. ABDOMEN:  Soft.  Active bowel sounds.  No masses or organomegaly. NEUROLOGIC:  The patient is alert and oriented to time, place, and person.  Cranial nerves II-XII are grossly intact. EXTREMITIES:  Shows the right knee with a varus deformity.  Tenderness of the medial joint line.  Full extension and further flexion to 120 degrees.  Sensation and circulation are intact.  IMPRESSION:  Medial compartment arthritis, right knee.  PLAN:  Medial unicompartmental arthroplasty, right knee.     Jaquelyn Bitter. Ernestene Kiel.     SJC/MEDQ  D:  06/02/2012  T:  06/03/2012  Job:  [id number]

## 2012-06-07 NOTE — Progress Notes (Deleted)
Pt requested advair tonight. I let him know it was scheduled for 8 am and 8 pm. He was upset that it wasn't "as needed" as he requested. I told pt I would page his doctor with his request. Pt told me "Not to worry about it." I did page Dr. Myers about the Advair. Will await for any new or changed orders, and continue to address patient's needs.       

## 2012-06-07 NOTE — Plan of Care (Signed)
Problem: Consults Goal: Diagnosis- Total Joint Replacement Partial/Uniknee     

## 2012-06-07 NOTE — Anesthesia Preprocedure Evaluation (Addendum)
Anesthesia Evaluation  Patient identified by MRN, date of birth, ID band Patient awake    Reviewed: Allergy & Precautions, H&P , NPO status , Patient's Chart, lab work & pertinent test results  Airway Mallampati: II TM Distance: >3 FB Neck ROM: Full    Dental No notable dental hx.    Pulmonary neg pulmonary ROS,  breath sounds clear to auscultation  Pulmonary exam normal       Cardiovascular negative cardio ROS  Rhythm:Regular Rate:Normal     Neuro/Psych PSYCHIATRIC DISORDERS OCDnegative neurological ROS     GI/Hepatic negative GI ROS, Neg liver ROS,   Endo/Other  Hypothyroidism   Renal/GU negative Renal ROS  negative genitourinary   Musculoskeletal negative musculoskeletal ROS (+)   Abdominal   Peds negative pediatric ROS (+)  Hematology negative hematology ROS (+)   Anesthesia Other Findings   Reproductive/Obstetrics negative OB ROS                          Anesthesia Physical Anesthesia Plan  ASA: II  Anesthesia Plan: Spinal   Post-op Pain Management:    Induction: Intravenous  Airway Management Planned:   Additional Equipment:   Intra-op Plan:   Post-operative Plan:   Informed Consent: I have reviewed the patients History and Physical, chart, labs and discussed the procedure including the risks, benefits and alternatives for the proposed anesthesia with the patient or authorized representative who has indicated his/her understanding and acceptance.   Dental advisory given  Plan Discussed with: CRNA  Anesthesia Plan Comments: (Discussed risks/benefits of spinal including headache, backache, failure, bleeding, infection, and nerve damage. Patient consents to spinal. Questions answered. Coagulation studies and platelet count acceptable. )       Anesthesia Quick Evaluation

## 2012-06-07 NOTE — Op Note (Signed)
NAME: NEOLA WORRALL    MEDICAL RECORD NO.: [id number]   FACILITY: [facility]   DATE OF BIRTH: Mar 04, 1946  PHYSICIAN: Madlyn Frankel. Charlann Boxer, M.D.    DATE OF PROCEDURE: 06/07/2012    OPERATIVE REPORT   PREOPERATIVE DIAGNOSIS: Right knee medial compartment osteoarthritis.   POSTOPERATIVE DIAGNOSIS: Right knee medial compartment osteoarthritis.  PROCEDURE: Right partial knee replacement utilizing Biomet Oxford knee  component, size small femur, a right medial size AA tibial tray with a size 5 insert.   SURGEON: Madlyn Frankel. Charlann Boxer, M.D.   ASSISTANT: Lanney Gins, PAC.  Please note that Mr. Carmon Sails was present for the entirety of the case,  utilized for preoperative positioning, perioperative retractor  management, general facilitation of the case and primary wound closure.   ANESTHESIA: Spinal.   SPECIMENS: None.   COMPLICATIONS: None.  DRAINS: 1 medium HV   TOURNIQUET TIME: 37 minutes at 250 mmHg.   INDICATIONS FOR PROCEDURE: The patient is a 66 yo female patient of mine who presented for evaluation of right knee pain.  They presented with primary complaints of pain on the medial side of their knee. Radiographs revealed advanced medial compartment arthritis with specifically an antero-medial wear pattern.  There was bone on bone changes noted with subchondral sclerosis and osteophytes present. The patient has had progressive problems failing to respond to conservative measures of medications, injections and activity modification. Risks of infection, DVT, component failure, need for future revision surgery were all discussed and reviewed.  Consent was obtained for benefit of pain relief.   PROCEDURE IN DETAIL: The patient was brought to the operative theater.  Once adequate anesthesia, preoperative antibiotics, 2gm Ancef administered, the patient was positioned in supine position with a right thigh tourniquet  placed. The right lower extremity was prepped and draped in sterile  fashion with  the leg on the Oxford leg holder.  The leg was allowed to flex to 120 degrees. A time-out  was performed identifying the patient, planned procedure, and extremity.  The leg was exsanguinated, tourniquet elevated to 250 mmHg. A midline  incision was made from the proximal pole of the patella to the tibial tubercle. A  soft tissue plane was created and partial median arthrotomy was then  made to allow for subluxation of the patella. Following initial synovectomy and  debridement, the osteophytes were removed off the medial aspect of the  knee.   Attention was first directed to the tibia. The tibial  extramedullary guide was positioned over the anterior crest of the tibia  and pinned into position, and using a measured resection guide from the  Oxford system, a 4 mm resection was made off the proximal tibia. First  the reciprocating saw along the medial aspect of the tibial spines, then the oscillating saw.    At this point, I sized this cut surface seem to be best fit for a size AA tibial tray.  With the retractors out of the wound and the knee held at 90 degrees the 4 feeler gauge had appropriate tension on the medial ligament.   At this point, the femoral canal was opened with a drill and the  intramedullary rod passed. Then using the guide for a small resection off  the posterior aspect of the femur was positioned over the mid portion of the medial femoral condyle.  The orientation was set using the guide that mates the femoral guide to the intramedullary rod.  The 2 drill holes were made into the distal femur.  The posterior guide  was then impacted into place and the posterior  femoral cut made.  At this point, I milled the distal femur with a size 4 spigot in place. At this point, we did a trial reduction of the small femur, size AA tibial tray and a 4 feeler gauge. At 90 degrees of  flexion and at 20 degrees of flexion the knee had symmetric tension on  the ligaments.   Given these  findings, the trial femoral component was removed. Final preparation of tibia was carried out by pinning it in position. Then  using a reciprocating saw I removed bone for the keel. Further bone was  removed with an osteotome.  Trial reduction was now carried out with the small femur, the AA keeled tibia, and a 5 lollipop insert. The balance of the  ligaments appeared to be symmetric at 20 degrees and 90 degrees. Given  all these findings, the trial components were removed.   Cement was mixed. The final components were opened. The knee was irrigated with  normal saline solution. Then final debridements of the  soft tissue was carried out, I also drilled the sclerotic bone with a drill.  The final components were cemented with a single batch of cement in a  two-stage technique with the tibial component cemented first. The knee  was then brought  to 45 degrees of flexion with a 5 feeler gauge, held with pressure for a minute and half.  After this the femoral component was cemented in place.  The knee was again held at 45 degrees of flexion while the cement fully cured.  Excess cement was removed throughout the knee. Tourniquet was let down  after 37 minutes. After the cement had fully cured and excessive cement  was removed throughout the knee there was no visualized cement present.   The final size 5 right medial insert was chosen and snapped into position. We re-irrigated  the knee. I placed a medium Hemovac drain deep. The extensor mechanism  was then reapproximated using a #1 Vicryl with the knee in flexion. The  remaining wound was closed with 2-0 Vicryl and a running 4-0 Monocryl.  The knee was cleaned, dried, and dressed sterilely using Dermabond and  Aquacel dressing. The drain site was dressed separately. The patient  was brought to the recovery room, Ace wrap in place, tolerating the  procedure well. He will be in the hospital for overnight observation.  We will initiate physical  therapy and progress to ambulate.     Madlyn Frankel Charlann Boxer, M.D.

## 2012-06-07 NOTE — Transfer of Care (Signed)
Immediate Anesthesia Transfer of Care Note  Patient: [name]  Procedure(s) Performed: Procedure(s) (LRB): UNICOMPARTMENTAL KNEE (Right)  Patient Location: PACU  Anesthesia Type: MAC and Spinal  Level of Consciousness: awake, alert , oriented, patient cooperative and responds to stimulation  Airway & Oxygen Therapy: Patient Spontanous Breathing and Patient connected to face mask oxygen  Post-op Assessment: Report given to PACU RN and Post -op Vital signs reviewed and stable  Post vital signs: Reviewed and stable, Spinal level T12  Complications: No apparent anesthesia complications

## 2012-06-07 NOTE — Interval H&P Note (Signed)
History and Physical Interval Note:  06/07/2012 11:51 AM  [name]  has presented today for surgery, with the diagnosis of Right Knee Medial Compartmental Osteoarthritis  The various methods of treatment have been discussed with the patient and family. After consideration of risks, benefits and other options for treatment, the patient has consented to  Procedure(s) (LRB): RIGHT UNICOMPARTMENTAL KNEE (Right) as a surgical intervention .  The patients' history has been reviewed, patient examined, no change in status, stable for surgery.  I have reviewed the patients' chart and labs.  Questions were answered to the patient's satisfaction.     Shelda Pal

## 2012-06-07 NOTE — Anesthesia Procedure Notes (Signed)
Spinal  Patient location during procedure: OR Start time: 06/07/2012 12:11 PM Staffing Anesthesiologist: Azell Der Performed by: anesthesiologist  Preanesthetic Checklist Completed: patient identified, site marked, surgical consent, pre-op evaluation, timeout performed, IV checked, risks and benefits discussed and monitors and equipment checked Spinal Block Patient position: sitting Prep: Betadine Patient monitoring: heart rate, continuous pulse ox and blood pressure Injection technique: single-shot Needle Needle type: Sprotte  Needle gauge: 24 G Needle length: 9 cm Additional Notes Expiration date of kit checked and confirmed. Patient tolerated procedure well, without complications. No heme. No paresthesia

## 2012-06-07 NOTE — Preoperative (Signed)
Beta Blockers   Reason not to administer Beta Blockers:Not Applicable, not on home BB 

## 2012-06-07 NOTE — Progress Notes (Signed)
Pt has been taking Potassium suppliment for the last 4 days

## 2012-06-08 ENCOUNTER — Encounter (HOSPITAL_COMMUNITY): Payer: Self-pay | Admitting: Orthopedic Surgery

## 2012-06-08 LAB — CBC
MCH: 29.4 pg (ref 26.0–34.0)
MCV: 86.6 fL (ref 78.0–100.0)
Platelets: 253 10*3/uL (ref 150–400)
RBC: 4.19 MIL/uL (ref 3.87–5.11)
RDW: 13.1 % (ref 11.5–15.5)

## 2012-06-08 LAB — BASIC METABOLIC PANEL
CO2: 25 mEq/L (ref 19–32)
Calcium: 8.4 mg/dL (ref 8.4–10.5)
Creatinine, Ser: 0.69 mg/dL (ref 0.50–1.10)

## 2012-06-08 MED ORDER — DSS 100 MG PO CAPS: 100.0000 mg | ORAL_CAPSULE | Freq: Two times a day (BID) | ORAL | Status: AC

## 2012-06-08 MED ORDER — DIPHENHYDRAMINE HCL 25 MG PO CAPS: 25.0000 mg | ORAL_CAPSULE | Freq: Four times a day (QID) | ORAL | Status: AC | PRN

## 2012-06-08 MED ORDER — FERROUS SULFATE 325 (65 FE) MG PO TABS: 325.0000 mg | ORAL_TABLET | Freq: Three times a day (TID) | ORAL | Status: DC

## 2012-06-08 MED ORDER — HYDROCODONE-ACETAMINOPHEN 7.5-325 MG PO TABS: 1.0000 | ORAL_TABLET | ORAL | Status: AC | PRN

## 2012-06-08 MED ORDER — METHOCARBAMOL 500 MG PO TABS: 500.0000 mg | ORAL_TABLET | Freq: Four times a day (QID) | ORAL | Status: AC | PRN

## 2012-06-08 MED ORDER — ASPIRIN EC 325 MG PO TBEC: 325.0000 mg | DELAYED_RELEASE_TABLET | Freq: Two times a day (BID) | ORAL | Status: AC

## 2012-06-08 MED ORDER — POLYETHYLENE GLYCOL 3350 17 G PO PACK: 17.0000 g | PACK | Freq: Two times a day (BID) | ORAL | Status: AC

## 2012-06-08 NOTE — Progress Notes (Signed)
OT Note:  Pt screened for OT.  She has assist for adls, a chair height commode and tub with seat.  She will have HHPT see if she can safety sit on seat without stepping over tub.  Screen only.  [place],  [phone] 06/08/2012

## 2012-06-08 NOTE — Progress Notes (Addendum)
  Subjective: 1 Day Post-Op Procedure(s) (LRB): UNICOMPARTMENTAL KNEE (Right)   Patient reports pain as mild, pain well controlled. No events throughout. Ready to be discharged home.  Objective:   VITALS:   Filed Vitals:   06/08/12 0621  BP: 121/73  Pulse: 84  Temp: 97.8 F (36.6 C)  Resp: 17    Neurovascular intact Dorsiflexion/Plantar flexion intact Incision: dressing C/Duncan/I No cellulitis present Compartment soft  LABS  Basename 06/08/12 0410  HGB 12.3  HCT 36.3  WBC 14.8*  PLT 253     Basename 06/08/12 0410  NA 135  K 4.1  BUN 8  CREATININE 0.69  GLUCOSE 127*     Assessment/Plan: 1 Day Post-Op Procedure(s) (LRB): UNICOMPARTMENTAL KNEE (Right)   HV drain Duncan/c'ed Foley cath Duncan/c'ed Advance diet Up with therapy Duncan/C IV fluids Discharge home with home health today after PTx2 Follow up in 2 weeks at [facility].  Follow-up Information    Follow up with OLIN,[name] in 2 weeks.   Contact information:   [facility] [address] [place] [address] [phone]          Anastasio Auerbach. Steffi Noviello   PAC  06/08/2012, 8:16 AM

## 2012-06-08 NOTE — Evaluation (Signed)
Physical Therapy Evaluation Patient Details Name: [name] MRN: [id number] DOB: September 24, 1946 Today's Date: 06/08/2012 Time: 0930-1008 PT Time Calculation (min): 38 min  PT Assessment / Plan / Recommendation Clinical Impression  Pt with R UKR presents with decreased R LE strength/ROM limiting functional mobility    PT Assessment  Patient needs continued PT services    Follow Up Recommendations  Home health PT    Barriers to Discharge        lEquipment Recommendations  None recommended by PT    Recommendations for Other Services     Frequency 7X/week    Precautions / Restrictions Precautions Precautions: Knee Required Braces or Orthoses: Knee Immobilizer - Right Knee Immobilizer - Right: Discontinue once straight leg raise with < 10 degree lag (Pt performed IND SLR this am) Restrictions Weight Bearing Restrictions: No   Pertinent Vitals/Pain 4/10; premedicated; cold packs provided      Mobility  Bed Mobility Bed Mobility: Supine to Sit Supine to Sit: 5: Supervision Transfers Transfers: Sit to Stand;Stand to Sit Sit to Stand: 4: Min guard;5: Supervision Stand to Sit: 4: Min guard;5: Supervision Details for Transfer Assistance: cues for use of UEs and for LE management Ambulation/Gait Ambulation/Gait Assistance: 4: Min guard;5: Supervision Ambulation Distance (Feet): 250 Feet Assistive device: Rolling walker Ambulation/Gait Assistance Details: min cues for posture and position from RW Gait Pattern: Step-to pattern;Step-through pattern Stairs: Yes Stairs Assistance: 4: Min assist Stairs Assistance Details (indicate cue type and reason): cues for foot/RW position and for sequence Stair Management Technique: No rails;Forwards;With walker Number of Stairs: 1     Exercises Total Joint Exercises Ankle Circles/Pumps: AROM;10 reps;Supine;Both Quad Sets: Both;10 reps;Supine;AROM Heel Slides: AAROM;10 reps;Supine;Right Straight Leg Raises: AROM;10 reps;Right;Supine     PT Diagnosis: Difficulty walking  PT Problem List: Decreased strength;Decreased range of motion;Decreased activity tolerance;Decreased mobility;Decreased knowledge of use of DME;Pain PT Treatment Interventions: DME instruction;Gait training;Stair training;Functional mobility training;Therapeutic activities;Therapeutic exercise;Patient/family education   PT Goals Acute Rehab PT Goals PT Goal Formulation:  (Pt to d/c this date)  Visit Information  Last PT Received On: 06/08/12 Assistance Needed: +1    Subjective Data  Subjective: I am so thrilled to have this done.  It feels better already Patient Stated Goal: Get on with my life   Prior Functioning  Home Living Lives With: Spouse Available Help at Discharge: Family Type of Home: House Home Access: Stairs to enter Secretary/administrator of Steps: 1 Entrance Stairs-Rails: None Home Layout: One level Home Adaptive Equipment: Walker - rolling Prior Function Level of Independence: Independent with assistive device(s) Able to Take Stairs?: Yes Driving: No Vocation: Retired Musician: No difficulties    Cognition  Overall Cognitive Status: Appears within functional limits for tasks assessed/performed Arousal/Alertness: Awake/alert Orientation Level: Appears intact for tasks assessed Behavior During Session: [facility] for tasks performed    Extremity/Trunk Assessment Right Upper Extremity Assessment RUE ROM/Strength/Tone: [facility] for tasks assessed Left Upper Extremity Assessment LUE ROM/Strength/Tone: WFL for tasks assessed Right Lower Extremity Assessment RLE ROM/Strength/Tone: Deficits RLE ROM/Strength/Tone Deficits: AAROM at knee -10 - 80 with 3/5 quads Left Lower Extremity Assessment LLE ROM/Strength/Tone: WFL for tasks assessed   Balance    End of Session PT - End of Session Activity Tolerance: Patient tolerated treatment well Patient left: in chair;with call bell/phone within reach Nurse Communication:  Mobility status   [name] 06/08/2012, 12:29 PM

## 2012-06-08 NOTE — Progress Notes (Signed)
Patient laying on bed and husband at bedside. Discharge instructions and education given with verbalization of understanding. She is to DC home today with home health service via [facility]. No s/s of acuted distress noted.

## 2012-06-08 NOTE — Care Management Note (Signed)
    Page 1 of 2   06/08/2012     12:16:29 PM   CARE MANAGEMENT NOTE 06/08/2012  Patient:  [name], [name]   Account Number:  [id number]  Date Initiated:  06/08/2012  Documentation initiated by:  Colleen Can  Subjective/Objective Assessment:   dx rt Baldo Ash replacemnt     Action/Plan:   CM spoke with patient.Plans are for patient to return to her home in [place] where spouse will be caregiver. Pt already has RW ,coomode seat, bath chair and cane. Want [facility] for [facility] services   Anticipated DC Date:  06/08/2012   Anticipated DC Plan:  HOME W HOME HEALTH SERVICES  In-house referral  NA      DC Planning Services  CM consult      [facility] Choice  HOME HEALTH   Choice offered to / List presented to:  C-1 Patient   DME arranged  NA      DME agency  NA     HH arranged  HH-2 PT      [facility] agency  [facility]   Status of service:  Completed, signed off Medicare Important Message given?  NA - LOS <3 / Initial given by admissions (If response is "NO", the following Medicare IM given date fields will be blank) Date Medicare IM given:   Date Additional Medicare IM given:    Discharge Disposition:  HOME W HOME HEALTH SERVICES  Per UR Regulation:  Reviewed for med. necessity/level of care/duration of stay  If discussed at Long Length of Stay Meetings, dates discussed:    Comments:  06/08/2012 Raynelle Bring BSN CCM [phone] List of agnecies placed on chart. [facility] services for [facility] will start tomorrow 06/09/2012.

## 2012-06-11 NOTE — Discharge Summary (Signed)
Physician Discharge Summary  Patient ID: [name] MRN: [id number] DOB/AGE: 24-Apr-1946 66 y.o.  Admit date: 06/07/2012 Discharge date: 06/08/2012  Procedures:  Procedure(s) (LRB): UNICOMPARTMENTAL KNEE (Right)  Attending Physician:  Dr. Durene Romans   Admission Diagnoses: Medial compartment arthritis, right knee  Discharge Diagnoses:  Principal Problem:  *S/P right UKR OCD Hypothyroidism  HPI: This is a 66 year old lady with a history of previous knee arthroscopy with medial compartment degeneration, who is failed conservative management. She is now scheduled for medial right knee unicompartmental arthroplasty. The surgery risks, benefits, and aftercare were discussed with the patient. Questions invited and answered. Note that she is a candidate for tranexamic acid, but is not a candidate for dexamethasone. She is given her home medicines of aspirin, Robaxin, iron, MiraLAX, and Colace. Her medical doctor is Dr. Laurann Montana, and again she plans on going home after surgery.  PCP: Cala Bradford, MD   Discharged Condition: good  Hospital Course:  Patient underwent the above stated procedure on 06/07/2012. Patient tolerated the procedure well and brought to the recovery room in good condition and subsequently to the floor.  POD #1 BP: 121/73 ; Pulse: 84 ; Temp: 97.8 F (36.6 C) ; Resp: 17 Pt's foley was removed, as well as the hemovac drain removed. IV was changed to a saline lock. Patient reports pain as mild, pain well controlled. No events throughout. Ready to be discharged home. Neurovascular intact, dorsiflexion/plantar flexion intact, incision: dressing C/D/I, no cellulitis present and compartment soft.   LABS  Basename  06/08/12 0410   HGB  12.3  HCT  36.3    Discharge Exam: General appearance: alert, cooperative and no distress Extremities: Homans sign is negative, no sign of DVT, no edema, redness or tenderness in the calves or thighs and no ulcers, gangrene or  trophic changes  Disposition:  Home  with follow up in 2 weeks   Follow-up Information    Follow up with OLIN,Rexford Prevo D in 2 weeks.   Contact information:   [facility] [address], [address] [place] [address] [phone]          Discharge Orders    Future Orders Please Complete By Expires   Diet - low sodium heart healthy      Call MD / Call 911      Comments:   If you experience chest pain or shortness of breath, CALL 911 and be transported to the hospital emergency room.  If you develope a fever above 101 F, pus (white drainage) or increased drainage or redness at the wound, or calf pain, call your surgeon's office.   Discharge instructions      Comments:   Maintain surgical dressing for 8 days, then replace with gauze and tape. Keep the area dry and clean until follow up. Follow up in 2 weeks at [facility]. Call with any questions or concerns.   Constipation Prevention      Comments:   Drink plenty of fluids.  Prune juice may be helpful.  You may use a stool softener, such as Colace (over the counter) 100 mg twice a day.  Use MiraLax (over the counter) for constipation as needed.   Increase activity slowly as tolerated      Driving restrictions      Comments:   No driving for 4 weeks   TED hose      Comments:   Use stockings (TED hose) for 2 weeks on both leg(s).  You may remove  them at night for sleeping.   Change dressing      Comments:   Maintain surgical dressing for 8 days, then change the dressing daily with sterile 4 x 4 inch gauze dressing and tape. Keep the area dry and clean.      Discharge Medication List as of 06/08/2012 11:13 AM    START taking these medications   Details  aspirin EC 325 MG tablet Take 1 tablet (325 mg total) by mouth 2 (two) times daily. X 4 weeks, Starting 06/08/2012, Until Fri 06/18/12, No Print    diphenhydrAMINE (BENADRYL) 25 mg capsule Take 1 capsule (25 mg total) by mouth every 6  (six) hours as needed for itching, allergies or sleep., Starting 06/08/2012, Until Fri 06/18/12, No Print    docusate sodium 100 MG CAPS Take 100 mg by mouth 2 (two) times daily., Starting 06/08/2012, Until Fri 06/18/12, No Print    ferrous sulfate 325 (65 FE) MG tablet Take 1 tablet (325 mg total) by mouth 3 (three) times daily after meals., Starting 06/08/2012, Until Wed 06/08/13, No Print    HYDROcodone-acetaminophen (NORCO) 7.5-325 MG per tablet Take 1-2 tablets by mouth every 4 (four) hours as needed for pain., Starting 06/08/2012, Until Fri 06/18/12, Print    methocarbamol (ROBAXIN) 500 MG tablet Take 1 tablet (500 mg total) by mouth every 6 (six) hours as needed (muscle spasms)., Starting 06/08/2012, Until Fri 06/18/12, No Print    polyethylene glycol (MIRALAX / GLYCOLAX) packet Take 17 g by mouth 2 (two) times daily., Starting 06/08/2012, Until Fri 06/11/12, No Print      CONTINUE these medications which have NOT CHANGED   Details  Calcium Carbonate-Vitamin D (CALCIUM + D) 600-200 MG-UNIT TABS Take 1 tablet by mouth daily., Until Discontinued, Historical Med    cholecalciferol (VITAMIN D) 1000 UNITS tablet Take 2,000 Units by mouth daily. Takes 2 tablets daily, Until Discontinued, Historical Med    estradiol (ESTRACE) 0.5 MG tablet Take 0.5 mg by mouth See admin instructions. Take for 3 weeks then off for 1 week.Then repeat., Until Discontinued, Historical Med    levothyroxine (SYNTHROID, LEVOTHROID) 88 MCG tablet Take 88 mcg by mouth daily., Until Discontinued, Historical Med    Red Yeast Rice 600 MG CAPS Take 1 capsule by mouth daily., Until Discontinued, Historical Med    venlafaxine XR (EFFEXOR-XR) 75 MG 24 hr capsule Take 75 mg by mouth daily., Until Discontinued, Historical Med    vitamin A 8000 UNIT capsule Take 8,000 Units by mouth daily., Until Discontinued, Historical Med    vitamin C (ASCORBIC ACID) 500 MG tablet Take 500 mg by mouth daily., Until Discontinued, Historical Med        STOP taking these medications     diphenhydramine-acetaminophen (TYLENOL PM) 25-500 MG TABS Comments:  Reason for Stopping:       HYDROcodone-acetaminophen (NORCO) 5-325 MG per tablet Comments:  Reason for Stopping:           Signed: Anastasio Auerbach. Arneda Sappington   PAC  06/11/2012, 8:37 AM

## 2013-04-25 ENCOUNTER — Other Ambulatory Visit: Payer: Self-pay | Admitting: Family Medicine

## 2013-05-18 ENCOUNTER — Other Ambulatory Visit: Payer: Self-pay | Admitting: Family Medicine

## 2013-05-24 ENCOUNTER — Other Ambulatory Visit (HOSPITAL_COMMUNITY): Payer: Self-pay | Admitting: Family Medicine

## 2013-05-24 ENCOUNTER — Encounter (HOSPITAL_COMMUNITY): Payer: Self-pay

## 2013-05-24 ENCOUNTER — Ambulatory Visit (HOSPITAL_COMMUNITY)
Admission: RE | Admit: 2013-05-24 | Discharge: 2013-05-24 | Disposition: A | Discharge status: Home / Self Care | Payer: Medicare Other | Source: Ambulatory Visit | Attending: Family Medicine | Admitting: Family Medicine

## 2013-05-24 DIAGNOSIS — M810 Age-related osteoporosis without current pathological fracture: Secondary | ICD-10-CM | POA: Insufficient documentation

## 2013-05-24 MED ORDER — ZOLEDRONIC ACID 5 MG/100ML IV SOLN
5.0000 mg | Freq: Once | INTRAVENOUS | Status: AC
  Administered 2013-05-24: 5 mg via INTRAVENOUS
  Filled 2013-05-24: qty 100

## 2013-05-24 MED ORDER — SODIUM CHLORIDE 0.9 % IV SOLN
Freq: Once | INTRAVENOUS | Status: AC
  Administered 2013-05-24: 14:00:00 via INTRAVENOUS

## 2013-07-27 ENCOUNTER — Other Ambulatory Visit: Payer: Self-pay

## 2013-08-02 ENCOUNTER — Emergency Department (HOSPITAL_BASED_OUTPATIENT_CLINIC_OR_DEPARTMENT_OTHER): Payer: Medicare Other

## 2013-08-02 ENCOUNTER — Emergency Department (HOSPITAL_BASED_OUTPATIENT_CLINIC_OR_DEPARTMENT_OTHER)
Admission: EM | Admit: 2013-08-02 | Discharge: 2013-08-03 | Disposition: A | Discharge status: Home / Self Care | Payer: Medicare Other | Attending: Emergency Medicine | Admitting: Emergency Medicine

## 2013-08-02 ENCOUNTER — Encounter (HOSPITAL_BASED_OUTPATIENT_CLINIC_OR_DEPARTMENT_OTHER): Payer: Self-pay | Admitting: *Deleted

## 2013-08-02 DIAGNOSIS — IMO0002 Reserved for concepts with insufficient information to code with codable children: Secondary | ICD-10-CM | POA: Insufficient documentation

## 2013-08-02 DIAGNOSIS — F429 Obsessive-compulsive disorder, unspecified: Secondary | ICD-10-CM | POA: Insufficient documentation

## 2013-08-02 DIAGNOSIS — E039 Hypothyroidism, unspecified: Secondary | ICD-10-CM | POA: Insufficient documentation

## 2013-08-02 DIAGNOSIS — M2550 Pain in unspecified joint: Secondary | ICD-10-CM | POA: Insufficient documentation

## 2013-08-02 DIAGNOSIS — M25569 Pain in unspecified knee: Secondary | ICD-10-CM | POA: Insufficient documentation

## 2013-08-02 DIAGNOSIS — M1710 Unilateral primary osteoarthritis, unspecified knee: Secondary | ICD-10-CM | POA: Insufficient documentation

## 2013-08-02 DIAGNOSIS — Z8719 Personal history of other diseases of the digestive system: Secondary | ICD-10-CM | POA: Insufficient documentation

## 2013-08-02 DIAGNOSIS — M810 Age-related osteoporosis without current pathological fracture: Secondary | ICD-10-CM | POA: Insufficient documentation

## 2013-08-02 DIAGNOSIS — M1990 Unspecified osteoarthritis, unspecified site: Secondary | ICD-10-CM

## 2013-08-02 DIAGNOSIS — M25562 Pain in left knee: Secondary | ICD-10-CM

## 2013-08-02 DIAGNOSIS — Z79899 Other long term (current) drug therapy: Secondary | ICD-10-CM | POA: Insufficient documentation

## 2013-08-02 MED ORDER — HYDROCODONE-ACETAMINOPHEN 5-325 MG PO TABS: 2.0000 | ORAL_TABLET | Freq: Once | ORAL | Status: DC

## 2013-08-02 MED ORDER — HYDROCODONE-ACETAMINOPHEN 5-325 MG PO TABS
1.0000 | ORAL_TABLET | Freq: Once | ORAL | Status: AC
  Administered 2013-08-02: 1 via ORAL
  Filled 2013-08-02: qty 1

## 2013-08-02 NOTE — ED Notes (Signed)
Patient transported to Ultrasound 

## 2013-08-02 NOTE — ED Provider Notes (Signed)
CSN: [id number]     Arrival date & time 08/02/13  2132 History     First MD Initiated Contact with Patient 08/02/13 2238     Chief Complaint  Patient presents with  . Leg Pain   (Consider location/radiation/quality/duration/timing/severity/associated sxs/prior Treatment) The history is provided by the patient, the spouse and a relative. No language interpreter was used.  [name] is a 67 year old female with past medical history of hypothyroidism, osteoarthritis, osteoporosis, OCD presenting to the emergency department with left knee pain that started today. Patient reported that this afternoon she had her leg dangling over the side of her chair for approximately 30 minutes. Patient reported that when she went to lift her left leg up and applied to the floor she had sudden onset of shooting pain originating from the left knee extending down to the left foot. Patient reported that her discomfort is primarily in her left knee described as a shooting pain that only occurs when the patient walks or moves her left knee. Patient reported that she does not feel any pain when the leg is at rest. Denied injury, fall, numbness, tingling. PCP Dr. Laurann [place] [facility] Dr. Charlann Boxer  Past Medical History  Diagnosis Date  . Hypothyroidism   . Osteoarthritis   . Osteoporosis   . Diverticulosis   . OCD (obsessive compulsive disorder)    Past Surgical History  Procedure Laterality Date  . Abdominal hysterectomy      partial then complete   . Bunionectomy      bunion surgery and hammer surgery x 2   . Knee arthroscopy      right   . Partial knee arthroplasty  06/07/2012    Procedure: UNICOMPARTMENTAL KNEE;  Surgeon: Shelda Pal, MD;  Location: [facility];  Service: Orthopedics;  Laterality: Right;   No family history on file. History  Substance Use Topics  . Smoking status: Never Smoker   . Smokeless tobacco: Never Used  . Alcohol Use: Yes     Comment: rare   OB History   Grav Para Term  Preterm Abortions TAB SAB Ect Mult Living                 Review of Systems  Constitutional: Negative for fever and chills.  HENT: Negative for sore throat, trouble swallowing, neck pain and neck stiffness.   Respiratory: Negative for chest tightness.   Cardiovascular: Negative for chest pain.  Musculoskeletal: Positive for arthralgias (Left knee pain).  Skin: Negative for wound.  Neurological: Negative for dizziness, weakness and numbness.  All other systems reviewed and are negative.    Allergies  Prednisone and Sulfonamide derivatives  Home Medications   Current Outpatient Rx  Name  Route  Sig  Dispense  Refill  . amLODipine (NORVASC) 5 MG tablet   Oral   Take 5 mg by mouth daily.         Marland Kitchen levothyroxine (SYNTHROID, LEVOTHROID) 112 MCG tablet   Oral   Take 112 mcg by mouth daily before breakfast.         . Calcium Carbonate-Vitamin D (CALCIUM + D) 600-200 MG-UNIT TABS   Oral   Take 1 tablet by mouth daily.         . cholecalciferol (VITAMIN D) 1000 UNITS tablet   Oral   Take 2,000 Units by mouth daily. Takes 2 tablets daily         . EXPIRED: diphenhydrAMINE (BENADRYL) 25 mg capsule   Oral   Take 1 capsule (  25 mg total) by mouth every 6 (six) hours as needed for itching, allergies or sleep.         Marland Kitchen estradiol (ESTRACE) 0.5 MG tablet   Oral   Take 1 mg by mouth See admin instructions. Take for 3 weeks then off for 1 week.Then repeat.         Marland Kitchen EXPIRED: ferrous sulfate 325 (65 FE) MG tablet   Oral   Take 1 tablet (325 mg total) by mouth 3 (three) times daily after meals.         Marland Kitchen levothyroxine (SYNTHROID, LEVOTHROID) 88 MCG tablet   Oral   Take 88 mcg by mouth daily.         . Red Yeast Rice 600 MG CAPS   Oral   Take 1 capsule by mouth daily.         Marland Kitchen venlafaxine XR (EFFEXOR-XR) 75 MG 24 hr capsule   Oral   Take 75 mg by mouth daily.         . vitamin A 8000 UNIT capsule   Oral   Take 8,000 Units by mouth daily.         .  vitamin C (ASCORBIC ACID) 500 MG tablet   Oral   Take 500 mg by mouth daily.          BP 138/76  Pulse 88  Temp(Src) 98.1 F (36.7 C) (Oral)  Resp 18  Ht 5\' 1"  (1.549 m)  Wt 145 lb (65.772 kg)  BMI 27.41 kg/m2  SpO2 98% Physical Exam  Nursing note and vitals reviewed. Constitutional: She is oriented to person, place, and time. She appears well-developed and well-nourished. No distress.  HENT:  Head: Normocephalic and atraumatic.  Cardiovascular: Normal rate, regular rhythm and normal heart sounds.  Exam reveals no friction rub.   No murmur heard. Pulses:      Radial pulses are 2+ on the right side, and 2+ on the left side.       Dorsalis pedis pulses are 2+ on the right side, and 2+ on the left side.  Negative swelling to the ankle and leg bilaterally. Negative pitting edema  Positive Homans sign to the left leg appear  Pulmonary/Chest: Effort normal and breath sounds normal. No respiratory distress. She has no wheezes. She has no rales.  Musculoskeletal: Normal range of motion. She exhibits tenderness.  Mild swelling noted to the left knee when compared to the right. Negative erythema, inflammation, effusion noted to the joints. Pain upon palpation to the posterior aspect of the left knee. Pain with flexion noted to the left knee. Negative anterior posterior drawer sign. Positive McMurray's sign. Negative signs of compartment syndrome.  Neurological: She is alert and oriented to person, place, and time. She exhibits normal muscle tone. Coordination normal.  Strength 5+/5+ with resistance to bilateral lower extremities Sensation intact to bilateral lower extremities with differentiation to sharp and dull touch.  Skin: Skin is warm and dry. No rash noted. She is not diaphoretic. No erythema.  Psychiatric: She has a normal mood and affect. Her behavior is normal. Thought content normal.    ED Course   Procedures (including critical care time)  Labs Reviewed - No data to  display US Venous Img Lower Unilateral Left  08/03/2013   *RADIOLOGY REPORT*  Clinical Data: Left posterior knee pain  LEFT LOWER EXTREMITY VENOUS DUPLEX ULTRASOUND  Technique:  Gray-scale sonography with graded compression, as well as color Doppler and duplex ultrasound were performed to evaluate  the deep venous system of the lower extremity from the level of the common femoral vein through the popliteal and proximal calf veins. Spectral Doppler was utilized to evaluate flow at rest and with distal augmentation maneuvers.  Comparison:  None.  Findings:  Normal compressibility of the common femoral, superficial femoral, and popliteal veins is demonstrated, as well as the visualized proximal calf veins.  No filling defects to suggest DVT on grayscale or color Doppler imaging.  Doppler waveforms show normal direction of venous flow, normal respiratory phasicity and response to augmentation.  IMPRESSION: No evidence of lower extremity deep vein thrombosis.   Original Report Authenticated By: Christiana Pellant, M.D.   Dg Knee Complete 4 Views Left  08/02/2013   *RADIOLOGY REPORT*  Clinical Data: Left knee pain.  LEFT KNEE - COMPLETE 4+ VIEW  Comparison: No priors.  Findings: Four views of the left knee demonstrate no acute displaced fracture, subluxation, dislocation, joint or soft tissue abnormality.  Mild joint space narrowing, subchondral sclerosis and osteophyte formation, most pronounced in the patellofemoral compartment, compatible with mild osteoarthritis.  IMPRESSION: 1.  No acute radiographic abnormality of the left knee.   Original Report Authenticated By: Trudie Reed, M.D.   1. Left knee pain   2. Osteoporosis   3. Osteoarthritis     MDM  Patient presenting to emergency department with left knee pain that started today. Patient reported that the knee pain started instantaneously after putting her leg on the ground when dangling her leg over the edge of a chair for approximately 30  minutes. Alert and oriented. Mild swelling noted to the left knee-negative signs of erythema, inflammation, deformities-doubt septic joint. Pain upon palpation to the posterior aspect as well as the medial aspect of the joint. Decreased flexion secondary to pain. Pain with McMurray sign. Strength intact. Sensation intact. Pulses palpable. Plain films of the left knee negative for acute abnormalities, negative effusions - mild joint space narrowing noted with subchondral sclerosis and osteophyte formation-consistent with patient's history of osteoarthritis. Venous Doppler negative for DVT, negative Baker's cyst. Definitive etiology of left knee pain unknown. Possible muscular strain or muscular cramp secondary to keeping leg in an opposition for a decent amount of time. Patient stable, afebrile. His comfort controlled in ED setting. Discharged patient with small dose of pain medications, discussed the patient precautions, course, disposal technique. Referred patient to PCP and orthopedics-patient requested Dr. Charlann Boxer since she is followed by him for OA and osteoporosis. Discussed with patient to rest and stay hydrated, elevate and ice the knee drop a day. Discussed with patient to continue to monitor symptoms and if symptoms are to worsen or change to report back to the emergency department-strict return instructions given. Patient agreed to plan of care, understood, all questions answered.  Raymon Mutton, PA-C 08/03/13 2243

## 2013-08-02 NOTE — ED Notes (Signed)
Pt was sitting down this afternoon and when she went to get up, had a sudden pain in her left leg. Pt describes the pain as constant and sts the pain begins just below her knee on the lateral side and moves up into her knee. Pt denies inury.

## 2013-08-03 NOTE — ED Notes (Signed)
Patient transported to Ultrasound 

## 2013-08-04 NOTE — ED Provider Notes (Signed)
Medical screening examination/treatment/procedure(s) were performed by non-physician practitioner and as supervising physician I was immediately available for consultation/collaboration.   Audree Camel, MD 08/04/13 1004

## 2013-10-27 ENCOUNTER — Other Ambulatory Visit: Payer: Self-pay

## 2015-04-23 ENCOUNTER — Encounter: Payer: Self-pay | Admitting: Gastroenterology

## 2016-02-18 DIAGNOSIS — H524 Presbyopia: Secondary | ICD-10-CM | POA: Diagnosis not present

## 2016-03-05 DIAGNOSIS — Z471 Aftercare following joint replacement surgery: Secondary | ICD-10-CM | POA: Diagnosis not present

## 2016-03-05 DIAGNOSIS — Z96651 Presence of right artificial knee joint: Secondary | ICD-10-CM | POA: Diagnosis not present

## 2016-04-15 DIAGNOSIS — L989 Disorder of the skin and subcutaneous tissue, unspecified: Secondary | ICD-10-CM | POA: Diagnosis not present

## 2016-04-15 DIAGNOSIS — R5383 Other fatigue: Secondary | ICD-10-CM | POA: Diagnosis not present

## 2016-04-15 DIAGNOSIS — N951 Menopausal and female climacteric states: Secondary | ICD-10-CM | POA: Diagnosis not present

## 2016-04-15 DIAGNOSIS — E039 Hypothyroidism, unspecified: Secondary | ICD-10-CM | POA: Diagnosis not present

## 2016-04-15 DIAGNOSIS — F419 Anxiety disorder, unspecified: Secondary | ICD-10-CM | POA: Diagnosis not present

## 2016-04-15 DIAGNOSIS — G4710 Hypersomnia, unspecified: Secondary | ICD-10-CM | POA: Diagnosis not present

## 2016-05-15 DIAGNOSIS — H17823 Peripheral opacity of cornea, bilateral: Secondary | ICD-10-CM | POA: Diagnosis not present

## 2016-05-15 DIAGNOSIS — H0289 Other specified disorders of eyelid: Secondary | ICD-10-CM | POA: Diagnosis not present

## 2016-05-15 DIAGNOSIS — H5213 Myopia, bilateral: Secondary | ICD-10-CM | POA: Diagnosis not present

## 2016-05-15 DIAGNOSIS — H01022 Squamous blepharitis right lower eyelid: Secondary | ICD-10-CM | POA: Diagnosis not present

## 2016-05-15 DIAGNOSIS — H52213 Irregular astigmatism, bilateral: Secondary | ICD-10-CM | POA: Diagnosis not present

## 2016-05-15 DIAGNOSIS — H04123 Dry eye syndrome of bilateral lacrimal glands: Secondary | ICD-10-CM | POA: Diagnosis not present

## 2016-05-15 DIAGNOSIS — H01025 Squamous blepharitis left lower eyelid: Secondary | ICD-10-CM | POA: Diagnosis not present

## 2016-05-15 DIAGNOSIS — H26493 Other secondary cataract, bilateral: Secondary | ICD-10-CM | POA: Diagnosis not present

## 2016-05-15 DIAGNOSIS — H01024 Squamous blepharitis left upper eyelid: Secondary | ICD-10-CM | POA: Diagnosis not present

## 2016-05-15 DIAGNOSIS — H01021 Squamous blepharitis right upper eyelid: Secondary | ICD-10-CM | POA: Diagnosis not present

## 2016-07-08 DIAGNOSIS — H17823 Peripheral opacity of cornea, bilateral: Secondary | ICD-10-CM | POA: Diagnosis not present

## 2016-07-08 DIAGNOSIS — H5213 Myopia, bilateral: Secondary | ICD-10-CM | POA: Diagnosis not present

## 2016-07-08 DIAGNOSIS — H01022 Squamous blepharitis right lower eyelid: Secondary | ICD-10-CM | POA: Diagnosis not present

## 2016-07-08 DIAGNOSIS — H52213 Irregular astigmatism, bilateral: Secondary | ICD-10-CM | POA: Diagnosis not present

## 2016-07-08 DIAGNOSIS — H0289 Other specified disorders of eyelid: Secondary | ICD-10-CM | POA: Diagnosis not present

## 2016-07-08 DIAGNOSIS — H01021 Squamous blepharitis right upper eyelid: Secondary | ICD-10-CM | POA: Diagnosis not present

## 2016-07-08 DIAGNOSIS — H04123 Dry eye syndrome of bilateral lacrimal glands: Secondary | ICD-10-CM | POA: Diagnosis not present

## 2016-07-08 DIAGNOSIS — H2640 Unspecified secondary cataract: Secondary | ICD-10-CM | POA: Diagnosis not present

## 2016-07-08 DIAGNOSIS — H01025 Squamous blepharitis left lower eyelid: Secondary | ICD-10-CM | POA: Diagnosis not present

## 2016-07-08 DIAGNOSIS — H01024 Squamous blepharitis left upper eyelid: Secondary | ICD-10-CM | POA: Diagnosis not present

## 2016-08-22 DIAGNOSIS — R5383 Other fatigue: Secondary | ICD-10-CM | POA: Diagnosis not present

## 2016-08-22 DIAGNOSIS — I10 Essential (primary) hypertension: Secondary | ICD-10-CM | POA: Diagnosis not present

## 2016-10-28 DIAGNOSIS — R05 Cough: Secondary | ICD-10-CM | POA: Diagnosis not present

## 2016-10-28 DIAGNOSIS — R5383 Other fatigue: Secondary | ICD-10-CM | POA: Diagnosis not present

## 2016-10-28 DIAGNOSIS — R609 Edema, unspecified: Secondary | ICD-10-CM | POA: Diagnosis not present

## 2016-10-28 DIAGNOSIS — E039 Hypothyroidism, unspecified: Secondary | ICD-10-CM | POA: Diagnosis not present

## 2016-10-28 DIAGNOSIS — Z23 Encounter for immunization: Secondary | ICD-10-CM | POA: Diagnosis not present

## 2016-10-28 DIAGNOSIS — I10 Essential (primary) hypertension: Secondary | ICD-10-CM | POA: Diagnosis not present

## 2016-10-28 DIAGNOSIS — N951 Menopausal and female climacteric states: Secondary | ICD-10-CM | POA: Diagnosis not present

## 2016-10-28 DIAGNOSIS — Z0000 Encounter for general adult medical examination without abnormal findings: Secondary | ICD-10-CM | POA: Diagnosis not present

## 2016-10-28 DIAGNOSIS — Z1211 Encounter for screening for malignant neoplasm of colon: Secondary | ICD-10-CM | POA: Diagnosis not present

## 2016-11-05 DIAGNOSIS — Z1211 Encounter for screening for malignant neoplasm of colon: Secondary | ICD-10-CM | POA: Diagnosis not present

## 2016-12-04 ENCOUNTER — Other Ambulatory Visit: Payer: Self-pay | Admitting: Family Medicine

## 2016-12-04 ENCOUNTER — Ambulatory Visit
Admission: RE | Admit: 2016-12-04 | Discharge: 2016-12-04 | Disposition: A | Discharge status: Home / Self Care | Payer: BLUE CROSS/BLUE SHIELD | Source: Ambulatory Visit | Attending: Family Medicine | Admitting: Family Medicine

## 2016-12-04 DIAGNOSIS — R059 Cough, unspecified: Secondary | ICD-10-CM

## 2016-12-04 DIAGNOSIS — Z1231 Encounter for screening mammogram for malignant neoplasm of breast: Secondary | ICD-10-CM | POA: Diagnosis not present

## 2016-12-04 DIAGNOSIS — R05 Cough: Secondary | ICD-10-CM

## 2017-01-22 DIAGNOSIS — Z471 Aftercare following joint replacement surgery: Secondary | ICD-10-CM | POA: Diagnosis not present

## 2017-01-22 DIAGNOSIS — Z96651 Presence of right artificial knee joint: Secondary | ICD-10-CM | POA: Diagnosis not present

## 2017-01-22 DIAGNOSIS — M7050 Other bursitis of knee, unspecified knee: Secondary | ICD-10-CM | POA: Diagnosis not present

## 2017-01-29 DIAGNOSIS — M25461 Effusion, right knee: Secondary | ICD-10-CM | POA: Diagnosis not present

## 2017-01-29 DIAGNOSIS — M5441 Lumbago with sciatica, right side: Secondary | ICD-10-CM | POA: Diagnosis not present

## 2017-01-29 DIAGNOSIS — M25561 Pain in right knee: Secondary | ICD-10-CM | POA: Diagnosis not present

## 2017-01-29 DIAGNOSIS — M5137 Other intervertebral disc degeneration, lumbosacral region: Secondary | ICD-10-CM | POA: Diagnosis not present

## 2017-01-29 DIAGNOSIS — G8929 Other chronic pain: Secondary | ICD-10-CM | POA: Diagnosis not present

## 2017-01-29 DIAGNOSIS — M25661 Stiffness of right knee, not elsewhere classified: Secondary | ICD-10-CM | POA: Diagnosis not present

## 2017-01-29 DIAGNOSIS — M5442 Lumbago with sciatica, left side: Secondary | ICD-10-CM | POA: Diagnosis not present

## 2017-01-29 DIAGNOSIS — Z96651 Presence of right artificial knee joint: Secondary | ICD-10-CM | POA: Diagnosis not present

## 2017-01-30 DIAGNOSIS — M25461 Effusion, right knee: Secondary | ICD-10-CM | POA: Diagnosis not present

## 2017-01-30 DIAGNOSIS — M25561 Pain in right knee: Secondary | ICD-10-CM | POA: Diagnosis not present

## 2017-01-30 DIAGNOSIS — Z96651 Presence of right artificial knee joint: Secondary | ICD-10-CM | POA: Diagnosis not present

## 2017-01-30 DIAGNOSIS — M25661 Stiffness of right knee, not elsewhere classified: Secondary | ICD-10-CM | POA: Diagnosis not present

## 2017-02-05 DIAGNOSIS — Z96651 Presence of right artificial knee joint: Secondary | ICD-10-CM | POA: Diagnosis not present

## 2017-02-05 DIAGNOSIS — M25561 Pain in right knee: Secondary | ICD-10-CM | POA: Diagnosis not present

## 2017-02-05 DIAGNOSIS — M25661 Stiffness of right knee, not elsewhere classified: Secondary | ICD-10-CM | POA: Diagnosis not present

## 2017-02-05 DIAGNOSIS — M25461 Effusion, right knee: Secondary | ICD-10-CM | POA: Diagnosis not present

## 2017-02-06 DIAGNOSIS — M25561 Pain in right knee: Secondary | ICD-10-CM | POA: Diagnosis not present

## 2017-02-06 DIAGNOSIS — M25461 Effusion, right knee: Secondary | ICD-10-CM | POA: Diagnosis not present

## 2017-02-06 DIAGNOSIS — M25661 Stiffness of right knee, not elsewhere classified: Secondary | ICD-10-CM | POA: Diagnosis not present

## 2017-02-06 DIAGNOSIS — Z96651 Presence of right artificial knee joint: Secondary | ICD-10-CM | POA: Diagnosis not present

## 2017-02-10 DIAGNOSIS — Z96651 Presence of right artificial knee joint: Secondary | ICD-10-CM | POA: Diagnosis not present

## 2017-02-10 DIAGNOSIS — M25461 Effusion, right knee: Secondary | ICD-10-CM | POA: Diagnosis not present

## 2017-02-10 DIAGNOSIS — M25561 Pain in right knee: Secondary | ICD-10-CM | POA: Diagnosis not present

## 2017-02-10 DIAGNOSIS — M25661 Stiffness of right knee, not elsewhere classified: Secondary | ICD-10-CM | POA: Diagnosis not present

## 2017-02-12 DIAGNOSIS — M25561 Pain in right knee: Secondary | ICD-10-CM | POA: Diagnosis not present

## 2017-02-12 DIAGNOSIS — Z96651 Presence of right artificial knee joint: Secondary | ICD-10-CM | POA: Diagnosis not present

## 2017-02-12 DIAGNOSIS — M25461 Effusion, right knee: Secondary | ICD-10-CM | POA: Diagnosis not present

## 2017-02-12 DIAGNOSIS — M25661 Stiffness of right knee, not elsewhere classified: Secondary | ICD-10-CM | POA: Diagnosis not present

## 2017-02-17 DIAGNOSIS — M25561 Pain in right knee: Secondary | ICD-10-CM | POA: Diagnosis not present

## 2017-02-17 DIAGNOSIS — M25661 Stiffness of right knee, not elsewhere classified: Secondary | ICD-10-CM | POA: Diagnosis not present

## 2017-02-17 DIAGNOSIS — M25461 Effusion, right knee: Secondary | ICD-10-CM | POA: Diagnosis not present

## 2017-02-17 DIAGNOSIS — Z96651 Presence of right artificial knee joint: Secondary | ICD-10-CM | POA: Diagnosis not present

## 2017-02-19 DIAGNOSIS — Z96651 Presence of right artificial knee joint: Secondary | ICD-10-CM | POA: Diagnosis not present

## 2017-02-19 DIAGNOSIS — M25661 Stiffness of right knee, not elsewhere classified: Secondary | ICD-10-CM | POA: Diagnosis not present

## 2017-02-19 DIAGNOSIS — M25461 Effusion, right knee: Secondary | ICD-10-CM | POA: Diagnosis not present

## 2017-02-19 DIAGNOSIS — M25561 Pain in right knee: Secondary | ICD-10-CM | POA: Diagnosis not present

## 2017-02-24 DIAGNOSIS — M25661 Stiffness of right knee, not elsewhere classified: Secondary | ICD-10-CM | POA: Diagnosis not present

## 2017-02-24 DIAGNOSIS — M25561 Pain in right knee: Secondary | ICD-10-CM | POA: Diagnosis not present

## 2017-02-24 DIAGNOSIS — M25461 Effusion, right knee: Secondary | ICD-10-CM | POA: Diagnosis not present

## 2017-02-24 DIAGNOSIS — Z96651 Presence of right artificial knee joint: Secondary | ICD-10-CM | POA: Diagnosis not present

## 2017-02-26 DIAGNOSIS — Z96651 Presence of right artificial knee joint: Secondary | ICD-10-CM | POA: Diagnosis not present

## 2017-02-26 DIAGNOSIS — M25561 Pain in right knee: Secondary | ICD-10-CM | POA: Diagnosis not present

## 2017-02-26 DIAGNOSIS — M25661 Stiffness of right knee, not elsewhere classified: Secondary | ICD-10-CM | POA: Diagnosis not present

## 2017-02-26 DIAGNOSIS — M25461 Effusion, right knee: Secondary | ICD-10-CM | POA: Diagnosis not present

## 2017-04-15 DIAGNOSIS — M810 Age-related osteoporosis without current pathological fracture: Secondary | ICD-10-CM | POA: Diagnosis not present

## 2017-04-15 DIAGNOSIS — N289 Disorder of kidney and ureter, unspecified: Secondary | ICD-10-CM | POA: Diagnosis not present

## 2017-04-27 DIAGNOSIS — M545 Low back pain: Secondary | ICD-10-CM | POA: Diagnosis not present

## 2017-04-27 DIAGNOSIS — K5909 Other constipation: Secondary | ICD-10-CM | POA: Diagnosis not present

## 2017-04-27 DIAGNOSIS — R109 Unspecified abdominal pain: Secondary | ICD-10-CM | POA: Diagnosis not present

## 2017-05-05 DIAGNOSIS — R202 Paresthesia of skin: Secondary | ICD-10-CM | POA: Diagnosis not present

## 2017-05-05 DIAGNOSIS — R109 Unspecified abdominal pain: Secondary | ICD-10-CM | POA: Diagnosis not present

## 2017-05-05 DIAGNOSIS — M799 Soft tissue disorder, unspecified: Secondary | ICD-10-CM | POA: Diagnosis not present

## 2017-05-05 DIAGNOSIS — R1012 Left upper quadrant pain: Secondary | ICD-10-CM | POA: Diagnosis not present

## 2017-05-07 ENCOUNTER — Other Ambulatory Visit: Payer: Self-pay | Admitting: Family Medicine

## 2017-05-07 ENCOUNTER — Ambulatory Visit (HOSPITAL_COMMUNITY)
Admission: RE | Admit: 2017-05-07 | Discharge: 2017-05-07 | Disposition: A | Discharge status: Home / Self Care | Payer: Medicare Other | Source: Ambulatory Visit | Attending: Family Medicine | Admitting: Family Medicine

## 2017-05-07 ENCOUNTER — Encounter (HOSPITAL_COMMUNITY): Payer: Self-pay

## 2017-05-07 DIAGNOSIS — M7989 Other specified soft tissue disorders: Secondary | ICD-10-CM

## 2017-05-07 DIAGNOSIS — M810 Age-related osteoporosis without current pathological fracture: Secondary | ICD-10-CM | POA: Diagnosis not present

## 2017-05-07 DIAGNOSIS — R1012 Left upper quadrant pain: Secondary | ICD-10-CM

## 2017-05-07 MED ORDER — ZOLEDRONIC ACID 5 MG/100ML IV SOLN
5.0000 mg | Freq: Once | INTRAVENOUS | Status: AC
  Administered 2017-05-07: 5 mg via INTRAVENOUS
  Filled 2017-05-07: qty 100

## 2017-05-07 MED ORDER — SODIUM CHLORIDE 0.9 % IV SOLN
Freq: Once | INTRAVENOUS | Status: AC
  Administered 2017-05-07: 09:00:00 via INTRAVENOUS

## 2017-05-07 NOTE — Discharge Instructions (Signed)

## 2017-05-07 NOTE — Progress Notes (Signed)
D/c instructions given on reclast.  Pt takes daily vitamin d and calcium.  Advised to remain on them unless MD instructs otherwise.  Pt voiced understanding.  Pt was d/c ambulatory to lobby with husband.

## 2017-05-13 ENCOUNTER — Ambulatory Visit
Admission: RE | Admit: 2017-05-13 | Discharge: 2017-05-13 | Disposition: A | Discharge status: Home / Self Care | Payer: Medicare Other | Source: Ambulatory Visit | Attending: Family Medicine | Admitting: Family Medicine

## 2017-05-13 DIAGNOSIS — K5900 Constipation, unspecified: Secondary | ICD-10-CM | POA: Diagnosis not present

## 2017-05-13 DIAGNOSIS — M7989 Other specified soft tissue disorders: Secondary | ICD-10-CM | POA: Diagnosis not present

## 2017-05-13 DIAGNOSIS — R1012 Left upper quadrant pain: Secondary | ICD-10-CM

## 2017-05-13 DIAGNOSIS — R109 Unspecified abdominal pain: Secondary | ICD-10-CM | POA: Diagnosis not present

## 2017-05-13 MED ORDER — IOPAMIDOL (ISOVUE-300) INJECTION 61%
100.0000 mL | Freq: Once | INTRAVENOUS | Status: AC | PRN
  Administered 2017-05-13: 100 mL via INTRAVENOUS

## 2017-05-14 ENCOUNTER — Encounter: Payer: Self-pay | Admitting: Gastroenterology

## 2017-05-16 DIAGNOSIS — M545 Low back pain: Secondary | ICD-10-CM | POA: Diagnosis not present

## 2017-05-21 ENCOUNTER — Encounter: Payer: Self-pay | Admitting: Nurse Practitioner

## 2017-05-21 ENCOUNTER — Ambulatory Visit (INDEPENDENT_AMBULATORY_CARE_PROVIDER_SITE_OTHER): Payer: Medicare Other | Admitting: Nurse Practitioner

## 2017-05-21 VITALS — BP 122/72 | HR 84 | Ht 60.24 in | Wt 156.0 lb

## 2017-05-21 DIAGNOSIS — R1032 Left lower quadrant pain: Secondary | ICD-10-CM | POA: Diagnosis not present

## 2017-05-21 MED ORDER — CIPROFLOXACIN HCL 500 MG PO TABS: 500.0000 mg | ORAL_TABLET | Freq: Two times a day (BID) | ORAL | 0 refills | Status: DC

## 2017-05-21 MED ORDER — TRAMADOL HCL 50 MG PO TABS: 50.0000 mg | ORAL_TABLET | Freq: Four times a day (QID) | ORAL | 0 refills | Status: DC | PRN

## 2017-05-21 MED ORDER — METRONIDAZOLE 500 MG PO TABS: 500.0000 mg | ORAL_TABLET | Freq: Three times a day (TID) | ORAL | 0 refills | Status: DC

## 2017-05-21 NOTE — Patient Instructions (Signed)
If you are age 71 or older, your body mass index should be between 23-30. Your Body mass index is 30.23 kg/m. If this is out of the aforementioned range listed, please consider follow up with your Primary Care Provider.  If you are age 3 or younger, your body mass index should be between 19-25. Your Body mass index is 30.23 kg/m. If this is out of the aformentioned range listed, please consider follow up with your Primary Care Provider.   We have sent the following medications to your pharmacy for you to pick up at your convenience: Ultram Cipro Flagyl  Please call back with an update.  Thank you for choosing me and [facility].   Tye Savoy, NP

## 2017-05-24 NOTE — Progress Notes (Addendum)
HPI:  This a 71 year old female known to Dr. Fuller Plan. She had a screening colonoscopy in 2011 with findings of moderate diverticular disease of the left side. She was recommended to have repeat colonoscopy in 2021. Patient is referred by PCP, Dr. Harlan Stains for abdominal pain and diverticular disease. Patient has been having left low back pain and left lower quadrant pain for a couple of months. CT scan 523 showed extensive diverticular disease without evidence for diverticulitis. Patient was seen by her orthopedist for the low back pain. Patient tells me that the orthopedist and PCP wanted her seen by [place] for severe diverticulitis. Patient, orthopnea pedis felt her symptoms were GI in nature but felt like an MRI of her lower back should be obtained. Scheduled for the MRI next week. Her pain seems to start in the left low back, radiates to the left lower quadrant. She seems to have some level of discomfort  at all times, even when sitting. Every few days she gets a severe attack of the pain or unclear reasons The pain is related to eating, in fact it always occurs during the night, waking her up. Patient was constipated several weeks ago, took [place]. After aweek on MiraLAX her bowel movements corrected there was no change in her pain. Laying flat a little bit. The nor twisting exacerbate the pain. No urinary discomfort. No fevers   Past Medical History:  Diagnosis Date  . Diverticulosis   . Hypothyroidism   . OCD (obsessive compulsive disorder)   . Osteoarthritis   . Osteoporosis      Past Surgical History:  Procedure Laterality Date  . ABDOMINAL HYSTERECTOMY     partial then complete   . BUNIONECTOMY     bunion surgery and hammer surgery x 2   . HAMMER TOE SURGERY     left foot-2012  . KNEE ARTHROSCOPY     right   . PARTIAL KNEE ARTHROPLASTY  06/07/2012   Procedure: UNICOMPARTMENTAL KNEE;  Surgeon: Mauri Pole, MD;  Location: [facility];  Service: Orthopedics;  Laterality: Right;    Family History  Problem Relation Age of Onset  . Stroke Mother   . Diabetes Mother   . Prostate cancer Father   . Heart disease Father   . Diabetes Father   . Diabetes Maternal Aunt   . Colon cancer Neg Hx   . Stomach cancer Neg Hx    Social History  Substance Use Topics  . Smoking status: Never Smoker  . Smokeless tobacco: Never Used  . Alcohol use Yes     Comment: rare   Current Outpatient Prescriptions  Medication Sig Dispense Refill  . amLODipine (NORVASC) 5 MG tablet Take 5 mg by mouth daily.    . Calcium Carbonate-Vitamin D (CALCIUM + D) 600-200 MG-UNIT TABS Take 1 tablet by mouth daily.    . cholecalciferol (VITAMIN D) 1000 UNITS tablet Take 2,000 Units by mouth daily. Takes 2 tablets daily    . estradiol (ESTRACE) 0.5 MG tablet Take 1 mg by mouth See admin instructions. Take for 3 weeks then off for 1 week.Then repeat.    . levothyroxine (SYNTHROID, LEVOTHROID) 112 MCG tablet Take by mouth daily before breakfast.     . levothyroxine (SYNTHROID, LEVOTHROID) 88 MCG tablet Take 88 mcg by mouth daily.    . meloxicam (MOBIC) 7.5 MG tablet Take 7.5 mg by mouth daily as needed for pain.    . pilocarpine (PILOCAR) 1 % ophthalmic solution Place 1 drop  into both eyes as needed.    . Red Yeast Rice 600 MG CAPS Take 1 capsule by mouth daily.    Marland Kitchen venlafaxine XR (EFFEXOR-XR) 75 MG 24 hr capsule Take 75 mg by mouth daily.    . vitamin A 8000 UNIT capsule Take 8,000 Units by mouth daily.    . vitamin C (ASCORBIC ACID) 500 MG tablet Take 500 mg by mouth daily.    . ciprofloxacin (CIPRO) 500 MG tablet Take 1 tablet (500 mg total) by mouth 2 (two) times daily. For 10 days 20 tablet 0  . diphenhydrAMINE (BENADRYL) 25 mg capsule Take 1 capsule (25 mg total) by mouth every 6 (six) hours as needed for itching, allergies or sleep.    . ferrous sulfate 325 (65 FE) MG tablet Take 1 tablet (325 mg total) by mouth 3 (three) times daily after meals.    . metroNIDAZOLE (FLAGYL) 500 MG tablet Take 1  tablet (500 mg total) by mouth 3 (three) times daily. For 10 days 30 tablet 0  . traMADol (ULTRAM) 50 MG tablet Take 1 tablet (50 mg total) by mouth every 6 (six) hours as needed. 20 tablet 0   No current facility-administered medications for this visit.    Allergies  Allergen Reactions  . Prednisone     Makes very "hyper" and leaves black and blue marks   . Sulfonamide Derivatives     REACTION: huge black and blue marks     Review of Systems: All systems reviewed and negative except where noted in HPI.    Physical Exam: BP 122/72   Pulse 84   Ht 5' 0.24" (1.53 m)   Wt 156 lb (70.8 kg)   BMI 30.23 kg/m  Constitutional:  Well-developed, white female in no acute distress. Psychiatric: Normal mood and affect. Behavior is normal. HEENT: Normocephalic and atraumatic. Conjunctivae are normal. No scleral icterus. Neck supple.  Cardiovascular: Normal rate, regular rhythm.  Pulmonary/chest: Effort normal and breath sounds normal. No wheezing, rales or rhonchi. Abdominal: Soft, nondistended, nontender. Bowel sounds active throughout. There are no masses palpable. No hepatomegaly. Extremities: no edema Lymphadenopathy: No cervical adenopathy noted. Neurological: Alert and oriented to person place and time. Skin: Skin is warm and dry. No rashes noted.   ASSESSMENT AND PLAN:  1. 46 old female with several week history of left lower back pain radiating around to left lower quadrant. Pain is intermittent, not related to eating or bowel movements. She did have some recent constipation, improved with Miralax. CT scan shows extensive diverticular disease without definite inflammation.  Patient has been evaluated by her orthopedist who apparently does not feel symptoms are ortho related has scheduled her for MRI (?spine) next week.  -Suppose she could have low-grade diverticulitis not detected on CT scan. She inquires about a trial of antibiotics. I told her they may not be helpful and that  antibiotics can come with side effects of course. Patient understands these things but would like to proceed with a course anyway.  I don't think this is unreasonable, will give her a course of Flagyl and Cipro -She is taking hydrocodone very sparingly so as not to run out until her [place] trip is complete. I gave her some Ultram to use for moderate pain so that she may reserve hydrocodone for more severe pain -Await MRI results. If this is not orthopedic related pain then we could proceed early with her colonoscopy given the pain and recent bowel changes (constipation).  2 . Liver cysts.  Multiple low-density lesions, some are too small to further characterize. Larger lesions are consistent with cysts, largest is seen in the lateral segment of the left lobe and measures 5.8 cm  Tye Savoy, NP  05/24/2017, 11:34 PM  cc Harlan Stains, MD

## 2017-05-25 ENCOUNTER — Telehealth: Payer: Self-pay | Admitting: Nurse Practitioner

## 2017-05-25 NOTE — Telephone Encounter (Signed)
Noted. Forward this to Tye Savoy, NP

## 2017-05-26 DIAGNOSIS — M542 Cervicalgia: Secondary | ICD-10-CM | POA: Diagnosis not present

## 2017-05-26 DIAGNOSIS — M545 Low back pain: Secondary | ICD-10-CM | POA: Diagnosis not present

## 2017-05-26 NOTE — Progress Notes (Signed)
Reviewed and agree with initial management plan.  Kalya Troeger T. Francesca Strome, MD FACG 

## 2017-05-26 NOTE — Telephone Encounter (Signed)
Okay well maybe it was low grade diverticulitis then. [name], please ask patient to call us back after she hears what Ortho says about the MRI. Even if it is abnormal I will not necessarily know if Ortho thinks it is causing some of her pain or not. Thanks

## 2017-05-29 DIAGNOSIS — M546 Pain in thoracic spine: Secondary | ICD-10-CM | POA: Diagnosis not present

## 2017-06-01 DIAGNOSIS — M545 Low back pain: Secondary | ICD-10-CM | POA: Diagnosis not present

## 2017-06-11 DIAGNOSIS — H01021 Squamous blepharitis right upper eyelid: Secondary | ICD-10-CM | POA: Diagnosis not present

## 2017-06-11 DIAGNOSIS — H01022 Squamous blepharitis right lower eyelid: Secondary | ICD-10-CM | POA: Diagnosis not present

## 2017-06-11 DIAGNOSIS — H04123 Dry eye syndrome of bilateral lacrimal glands: Secondary | ICD-10-CM | POA: Diagnosis not present

## 2017-06-11 DIAGNOSIS — H01024 Squamous blepharitis left upper eyelid: Secondary | ICD-10-CM | POA: Diagnosis not present

## 2017-06-13 DIAGNOSIS — M545 Low back pain: Secondary | ICD-10-CM | POA: Diagnosis not present

## 2017-06-15 NOTE — Telephone Encounter (Signed)
Abdominal pain improved after the antibiotics. Ortho is still working on the pain in her back. She starts physical therapy. Thanks [place] for the follow up call.

## 2017-06-16 DIAGNOSIS — M545 Low back pain: Secondary | ICD-10-CM | POA: Diagnosis not present

## 2017-06-16 DIAGNOSIS — R262 Difficulty in walking, not elsewhere classified: Secondary | ICD-10-CM | POA: Diagnosis not present

## 2017-06-16 DIAGNOSIS — M6281 Muscle weakness (generalized): Secondary | ICD-10-CM | POA: Diagnosis not present

## 2017-06-16 DIAGNOSIS — M799 Soft tissue disorder, unspecified: Secondary | ICD-10-CM | POA: Diagnosis not present

## 2017-06-18 DIAGNOSIS — M545 Low back pain: Secondary | ICD-10-CM | POA: Diagnosis not present

## 2017-06-18 DIAGNOSIS — R262 Difficulty in walking, not elsewhere classified: Secondary | ICD-10-CM | POA: Diagnosis not present

## 2017-06-18 DIAGNOSIS — M799 Soft tissue disorder, unspecified: Secondary | ICD-10-CM | POA: Diagnosis not present

## 2017-06-18 DIAGNOSIS — M6281 Muscle weakness (generalized): Secondary | ICD-10-CM | POA: Diagnosis not present

## 2017-06-23 DIAGNOSIS — M545 Low back pain: Secondary | ICD-10-CM | POA: Diagnosis not present

## 2017-06-23 DIAGNOSIS — M6281 Muscle weakness (generalized): Secondary | ICD-10-CM | POA: Diagnosis not present

## 2017-06-23 DIAGNOSIS — M799 Soft tissue disorder, unspecified: Secondary | ICD-10-CM | POA: Diagnosis not present

## 2017-06-23 DIAGNOSIS — R262 Difficulty in walking, not elsewhere classified: Secondary | ICD-10-CM | POA: Diagnosis not present

## 2017-06-25 DIAGNOSIS — R262 Difficulty in walking, not elsewhere classified: Secondary | ICD-10-CM | POA: Diagnosis not present

## 2017-06-25 DIAGNOSIS — M6281 Muscle weakness (generalized): Secondary | ICD-10-CM | POA: Diagnosis not present

## 2017-06-25 DIAGNOSIS — M545 Low back pain: Secondary | ICD-10-CM | POA: Diagnosis not present

## 2017-06-25 DIAGNOSIS — M799 Soft tissue disorder, unspecified: Secondary | ICD-10-CM | POA: Diagnosis not present

## 2017-06-29 ENCOUNTER — Ambulatory Visit: Payer: Medicare Other | Admitting: Gastroenterology

## 2017-06-29 DIAGNOSIS — R262 Difficulty in walking, not elsewhere classified: Secondary | ICD-10-CM | POA: Diagnosis not present

## 2017-06-29 DIAGNOSIS — M799 Soft tissue disorder, unspecified: Secondary | ICD-10-CM | POA: Diagnosis not present

## 2017-06-29 DIAGNOSIS — M545 Low back pain: Secondary | ICD-10-CM | POA: Diagnosis not present

## 2017-06-29 DIAGNOSIS — M6281 Muscle weakness (generalized): Secondary | ICD-10-CM | POA: Diagnosis not present

## 2017-07-02 DIAGNOSIS — R262 Difficulty in walking, not elsewhere classified: Secondary | ICD-10-CM | POA: Diagnosis not present

## 2017-07-02 DIAGNOSIS — M799 Soft tissue disorder, unspecified: Secondary | ICD-10-CM | POA: Diagnosis not present

## 2017-07-02 DIAGNOSIS — M6281 Muscle weakness (generalized): Secondary | ICD-10-CM | POA: Diagnosis not present

## 2017-07-02 DIAGNOSIS — M545 Low back pain: Secondary | ICD-10-CM | POA: Diagnosis not present

## 2017-07-06 DIAGNOSIS — R262 Difficulty in walking, not elsewhere classified: Secondary | ICD-10-CM | POA: Diagnosis not present

## 2017-07-06 DIAGNOSIS — M799 Soft tissue disorder, unspecified: Secondary | ICD-10-CM | POA: Diagnosis not present

## 2017-07-06 DIAGNOSIS — M6281 Muscle weakness (generalized): Secondary | ICD-10-CM | POA: Diagnosis not present

## 2017-07-06 DIAGNOSIS — M545 Low back pain: Secondary | ICD-10-CM | POA: Diagnosis not present

## 2017-07-09 DIAGNOSIS — M545 Low back pain: Secondary | ICD-10-CM | POA: Diagnosis not present

## 2017-07-09 DIAGNOSIS — R262 Difficulty in walking, not elsewhere classified: Secondary | ICD-10-CM | POA: Diagnosis not present

## 2017-07-09 DIAGNOSIS — M799 Soft tissue disorder, unspecified: Secondary | ICD-10-CM | POA: Diagnosis not present

## 2017-07-09 DIAGNOSIS — M6281 Muscle weakness (generalized): Secondary | ICD-10-CM | POA: Diagnosis not present

## 2017-07-16 DIAGNOSIS — M545 Low back pain: Secondary | ICD-10-CM | POA: Diagnosis not present

## 2017-07-16 DIAGNOSIS — R262 Difficulty in walking, not elsewhere classified: Secondary | ICD-10-CM | POA: Diagnosis not present

## 2017-07-16 DIAGNOSIS — M799 Soft tissue disorder, unspecified: Secondary | ICD-10-CM | POA: Diagnosis not present

## 2017-07-16 DIAGNOSIS — M6281 Muscle weakness (generalized): Secondary | ICD-10-CM | POA: Diagnosis not present

## 2017-07-20 DIAGNOSIS — M545 Low back pain: Secondary | ICD-10-CM | POA: Diagnosis not present

## 2017-07-20 DIAGNOSIS — M799 Soft tissue disorder, unspecified: Secondary | ICD-10-CM | POA: Diagnosis not present

## 2017-07-20 DIAGNOSIS — R262 Difficulty in walking, not elsewhere classified: Secondary | ICD-10-CM | POA: Diagnosis not present

## 2017-07-20 DIAGNOSIS — M6281 Muscle weakness (generalized): Secondary | ICD-10-CM | POA: Diagnosis not present

## 2017-07-23 DIAGNOSIS — M6281 Muscle weakness (generalized): Secondary | ICD-10-CM | POA: Diagnosis not present

## 2017-07-23 DIAGNOSIS — R262 Difficulty in walking, not elsewhere classified: Secondary | ICD-10-CM | POA: Diagnosis not present

## 2017-07-23 DIAGNOSIS — M545 Low back pain: Secondary | ICD-10-CM | POA: Diagnosis not present

## 2017-07-23 DIAGNOSIS — M799 Soft tissue disorder, unspecified: Secondary | ICD-10-CM | POA: Diagnosis not present

## 2017-08-25 DIAGNOSIS — M8589 Other specified disorders of bone density and structure, multiple sites: Secondary | ICD-10-CM | POA: Diagnosis not present

## 2017-09-09 ENCOUNTER — Emergency Department (HOSPITAL_BASED_OUTPATIENT_CLINIC_OR_DEPARTMENT_OTHER): Payer: Medicare Other

## 2017-09-09 ENCOUNTER — Emergency Department (HOSPITAL_BASED_OUTPATIENT_CLINIC_OR_DEPARTMENT_OTHER)
Admission: EM | Admit: 2017-09-09 | Discharge: 2017-09-09 | Disposition: A | Discharge status: Home / Self Care | Payer: Medicare Other | Attending: Emergency Medicine | Admitting: Emergency Medicine

## 2017-09-09 ENCOUNTER — Encounter (HOSPITAL_BASED_OUTPATIENT_CLINIC_OR_DEPARTMENT_OTHER): Payer: Self-pay | Admitting: Emergency Medicine

## 2017-09-09 DIAGNOSIS — K5792 Diverticulitis of intestine, part unspecified, without perforation or abscess without bleeding: Secondary | ICD-10-CM | POA: Diagnosis not present

## 2017-09-09 DIAGNOSIS — Z96651 Presence of right artificial knee joint: Secondary | ICD-10-CM | POA: Diagnosis not present

## 2017-09-09 DIAGNOSIS — E039 Hypothyroidism, unspecified: Secondary | ICD-10-CM | POA: Insufficient documentation

## 2017-09-09 DIAGNOSIS — R109 Unspecified abdominal pain: Secondary | ICD-10-CM | POA: Diagnosis not present

## 2017-09-09 DIAGNOSIS — R1030 Lower abdominal pain, unspecified: Secondary | ICD-10-CM | POA: Diagnosis present

## 2017-09-09 DIAGNOSIS — Z79899 Other long term (current) drug therapy: Secondary | ICD-10-CM | POA: Diagnosis not present

## 2017-09-09 DIAGNOSIS — K5732 Diverticulitis of large intestine without perforation or abscess without bleeding: Secondary | ICD-10-CM | POA: Diagnosis not present

## 2017-09-09 LAB — CBC WITH DIFFERENTIAL/PLATELET
BASOS ABS: 0.1 10*3/uL (ref 0.0–0.1)
BASOS PCT: 0 %
Eosinophils Absolute: 0 10*3/uL (ref 0.0–0.7)
Eosinophils Relative: 0 %
HCT: 38.2 % (ref 36.0–46.0)
Hemoglobin: 13.3 g/dL (ref 12.0–15.0)
LYMPHS PCT: 9 %
Lymphs Abs: 1 10*3/uL (ref 0.7–4.0)
MCH: 30.6 pg (ref 26.0–34.0)
MCHC: 34.8 g/dL (ref 30.0–36.0)
MCV: 87.8 fL (ref 78.0–100.0)
Monocytes Absolute: 0.4 10*3/uL (ref 0.1–1.0)
Monocytes Relative: 3 %
Neutro Abs: 10.4 10*3/uL — ABNORMAL HIGH (ref 1.7–7.7)
Neutrophils Relative %: 88 %
Platelets: 293 10*3/uL (ref 150–400)
RBC: 4.35 MIL/uL (ref 3.87–5.11)
RDW: 13.1 % (ref 11.5–15.5)
WBC: 11.9 10*3/uL — AB (ref 4.0–10.5)

## 2017-09-09 LAB — URINALYSIS, MICROSCOPIC (REFLEX)

## 2017-09-09 LAB — BASIC METABOLIC PANEL
ANION GAP: 11 (ref 5–15)
BUN: 11 mg/dL (ref 6–20)
CO2: 27 mmol/L (ref 22–32)
Calcium: 9.3 mg/dL (ref 8.9–10.3)
Chloride: 101 mmol/L (ref 101–111)
Creatinine, Ser: 0.99 mg/dL (ref 0.44–1.00)
GFR, EST NON AFRICAN AMERICAN: 56 mL/min — AB (ref >60)
GLUCOSE: 127 mg/dL — AB (ref 65–99)
POTASSIUM: 3.9 mmol/L (ref 3.5–5.1)
SODIUM: 139 mmol/L (ref 135–145)

## 2017-09-09 LAB — URINALYSIS, ROUTINE W REFLEX MICROSCOPIC
BILIRUBIN URINE: NEGATIVE
GLUCOSE, UA: NEGATIVE mg/dL
KETONES UR: NEGATIVE mg/dL
NITRITE: NEGATIVE
Protein, ur: NEGATIVE mg/dL
Specific Gravity, Urine: 1.01 (ref 1.005–1.030)
pH: 6 (ref 5.0–8.0)

## 2017-09-09 MED ORDER — METRONIDAZOLE 500 MG PO TABS: 500.0000 mg | ORAL_TABLET | Freq: Two times a day (BID) | ORAL | 0 refills | Status: DC

## 2017-09-09 MED ORDER — HYDROCODONE-ACETAMINOPHEN 5-325 MG PO TABS: 2.0000 | ORAL_TABLET | ORAL | 0 refills | Status: DC | PRN

## 2017-09-09 MED ORDER — IOPAMIDOL (ISOVUE-300) INJECTION 61%
100.0000 mL | Freq: Once | INTRAVENOUS | Status: AC | PRN
  Administered 2017-09-09: 100 mL via INTRAVENOUS

## 2017-09-09 MED ORDER — CIPROFLOXACIN HCL 500 MG PO TABS: 500.0000 mg | ORAL_TABLET | Freq: Two times a day (BID) | ORAL | 0 refills | Status: DC

## 2017-09-09 MED ORDER — CIPROFLOXACIN HCL 500 MG PO TABS
500.0000 mg | ORAL_TABLET | Freq: Once | ORAL | Status: AC
  Administered 2017-09-09: 500 mg via ORAL
  Filled 2017-09-09: qty 1

## 2017-09-09 MED ORDER — HYDROCODONE-ACETAMINOPHEN 5-325 MG PO TABS: 1.0000 | ORAL_TABLET | ORAL | 0 refills | Status: DC | PRN

## 2017-09-09 MED ORDER — METRONIDAZOLE 500 MG PO TABS
500.0000 mg | ORAL_TABLET | Freq: Once | ORAL | Status: AC
Start: 2017-09-09 — End: 2017-09-09
  Administered 2017-09-09: 500 mg via ORAL
  Filled 2017-09-09: qty 1

## 2017-09-09 NOTE — ED Triage Notes (Signed)
Patient reports lower abdominal pain which began Sunday.  Reports nausea.  Denies vomiting, diarrhea.

## 2017-09-09 NOTE — ED Notes (Signed)
Pain is severe and stabbing, increasing when standing up

## 2017-09-09 NOTE — ED Provider Notes (Signed)
[place] DEPT [facility] Provider Note   CSN: [id number] Arrival date & time: 09/09/17  1457     History   Chief Complaint Chief Complaint  Patient presents with  . Abdominal Pain    HPI [name] is a 71 y.o. female. Chief complaint is lower abdominal pressure  HPI:  71 year old female. States that since yesterday she's had some pressure in her bilateral lower abdomen. Left greater than right. It is painful to move. No pain with coughing. Poor appetite but no nausea or vomiting. Normal bowel movement today. No diarrhea. No bleeding. She has urinary frequency. She did have burning or dysuria. She does not have some hesitancy or small amounts. In fact, she states she's urinated twice since being emergency room.  When asked about previous colonoscopies or diverticulitis she states she was told once she had diverticulitis then another time her physician told her she did not. It sounds like she has diverticuli but had a previous CT scan that did not show diverticulitis. Review of her records confirm diverticulitis via colonoscopy, and previous CT scan.  Past Medical History:  Diagnosis Date  . Diverticulosis   . Hypothyroidism   . OCD (obsessive compulsive disorder)   . Osteoarthritis   . Osteoporosis     Patient Active Problem List   Diagnosis Date Noted  . S/P right UKR 06/07/2012    Past Surgical History:  Procedure Laterality Date  . ABDOMINAL HYSTERECTOMY     partial then complete   . BUNIONECTOMY     bunion surgery and hammer surgery x 2   . HAMMER TOE SURGERY     left foot-2012  . KNEE ARTHROSCOPY     right   . PARTIAL KNEE ARTHROPLASTY  06/07/2012   Procedure: UNICOMPARTMENTAL KNEE;  Surgeon: Mauri Pole, MD;  Location: [facility];  Service: Orthopedics;  Laterality: Right;    OB History    No data available       Home Medications    Prior to Admission medications   Medication Sig Start Date End Date Taking? Authorizing Provider  amLODipine  (NORVASC) 5 MG tablet Take 5 mg by mouth daily.    [provider]  Calcium Carbonate-Vitamin D (CALCIUM + D) 600-200 MG-UNIT TABS Take 1 tablet by mouth daily.    [provider]  cholecalciferol (VITAMIN D) 1000 UNITS tablet Take 2,000 Units by mouth daily. Takes 2 tablets daily    [provider]  ciprofloxacin (CIPRO) 500 MG tablet Take 1 tablet (500 mg total) by mouth 2 (two) times daily. 09/09/17   Tanna Furry, MD  diphenhydrAMINE (BENADRYL) 25 mg capsule Take 1 capsule (25 mg total) by mouth every 6 (six) hours as needed for itching, allergies or sleep. 06/08/12 06/18/12  Danae Orleans, PA-C  estradiol (ESTRACE) 0.5 MG tablet Take 1 mg by mouth See admin instructions. Take for 3 weeks then off for 1 week.Then repeat.    [provider]  ferrous sulfate 325 (65 FE) MG tablet Take 1 tablet (325 mg total) by mouth 3 (three) times daily after meals. 06/08/12 06/08/13  Danae Orleans, PA-C  HYDROcodone-acetaminophen (NORCO/VICODIN) 5-325 MG tablet Take 2 tablets by mouth every 4 (four) hours as needed. 09/09/17   Tanna Furry, MD  levothyroxine (SYNTHROID, LEVOTHROID) 112 MCG tablet Take by mouth daily before breakfast.     [provider]  levothyroxine (SYNTHROID, LEVOTHROID) 88 MCG tablet Take 88 mcg by mouth daily.    [provider]  meloxicam (MOBIC) 7.5 MG  tablet Take 7.5 mg by mouth daily as needed for pain.    [provider]  metroNIDAZOLE (FLAGYL) 500 MG tablet Take 1 tablet (500 mg total) by mouth 2 (two) times daily. 09/09/17   Tanna Furry, MD  pilocarpine (PILOCAR) 1 % ophthalmic solution Place 1 drop into both eyes as needed.    [provider]  Red Yeast Rice 600 MG CAPS Take 1 capsule by mouth daily.    [provider]  traMADol (ULTRAM) 50 MG tablet Take 1 tablet (50 mg total) by mouth every 6 (six) hours as needed. 05/21/17   Willia Craze, NP  venlafaxine XR (EFFEXOR-XR) 75 MG 24 hr capsule Take 75 mg  by mouth daily.    [provider]  vitamin A 8000 UNIT capsule Take 8,000 Units by mouth daily.    [provider]  vitamin C (ASCORBIC ACID) 500 MG tablet Take 500 mg by mouth daily.    [provider]    Family History Family History  Problem Relation Age of Onset  . Stroke Mother   . Diabetes Mother   . Prostate cancer Father   . Heart disease Father   . Diabetes Father   . Diabetes Maternal Aunt   . Colon cancer Neg Hx   . Stomach cancer Neg Hx     Social History Social History  Substance Use Topics  . Smoking status: Never Smoker  . Smokeless tobacco: Never Used  . Alcohol use Yes     Comment: rare     Allergies   Prednisone and Sulfonamide derivatives   Review of Systems Review of Systems  Constitutional: Negative for appetite change, chills, diaphoresis, fatigue and fever.  HENT: Negative for mouth sores, sore throat and trouble swallowing.   Eyes: Negative for visual disturbance.  Respiratory: Negative for cough, chest tightness, shortness of breath and wheezing.   Cardiovascular: Negative for chest pain.  Gastrointestinal: Positive for abdominal pain. Negative for abdominal distention, diarrhea, nausea and vomiting.  Endocrine: Negative for polydipsia, polyphagia and polyuria.  Genitourinary: Negative for dysuria, frequency and hematuria.  Musculoskeletal: Negative for gait problem.  Skin: Negative for color change, pallor and rash.  Neurological: Negative for dizziness, syncope, light-headedness and headaches.  Hematological: Does not bruise/bleed easily.  Psychiatric/Behavioral: Negative for behavioral problems and confusion.     Physical Exam Updated Vital Signs BP 113/75 (BP Location: Left Arm)   Pulse 84   Temp 98.2 F (36.8 C) (Oral)   Resp 16   Ht 5\' 1"  (1.549 m)   Wt 70.3 kg (155 lb)   SpO2 100%   BMI 29.29 kg/m   Physical Exam  Constitutional: She is oriented to person, place, and time. She appears  well-developed and well-nourished. No distress.  HENT:  Head: Normocephalic.  Eyes: Pupils are equal, round, and reactive to light. Conjunctivae are normal. No scleral icterus.  Neck: Normal range of motion. Neck supple. No thyromegaly present.  Cardiovascular: Normal rate and regular rhythm.  Exam reveals no gallop and no friction rub.   No murmur heard. Pulmonary/Chest: Effort normal and breath sounds normal. No respiratory distress. She has no wheezes. She has no rales.  Abdominal: Soft. Bowel sounds are normal. She exhibits no distension. There is no tenderness. There is no rebound.  Minimal tenderness to suprapubic and left lower abdomen. No guarding, rebound, or peritoneal irritation.  Musculoskeletal: Normal range of motion.  Neurological: She is alert and oriented to person, place, and time.  Skin: Skin is  warm and dry. No rash noted.  Psychiatric: She has a normal mood and affect. Her behavior is normal.     ED Treatments / Results  Labs (all labs ordered are listed, but only abnormal results are displayed) Labs Reviewed  CBC WITH DIFFERENTIAL/PLATELET - Abnormal; Notable for the following:       Result Value   WBC 11.9 (*)    Neutro Abs 10.4 (*)    All other components within normal limits  BASIC METABOLIC PANEL - Abnormal; Notable for the following:    Glucose, Bld 127 (*)    GFR calc non Af Amer 56 (*)    All other components within normal limits  URINALYSIS, ROUTINE W REFLEX MICROSCOPIC - Abnormal; Notable for the following:    Hgb urine dipstick SMALL (*)    Leukocytes, UA TRACE (*)    All other components within normal limits  URINALYSIS, MICROSCOPIC (REFLEX) - Abnormal; Notable for the following:    Bacteria, UA RARE (*)    Squamous Epithelial / LPF 0-5 (*)    All other components within normal limits    EKG  EKG Interpretation None       Radiology Ct Abdomen Pelvis W Contrast  Result Date: 09/09/2017 CLINICAL DATA:  Acute lower abdominal pain for 3  days with nausea. EXAM: CT ABDOMEN AND PELVIS WITH CONTRAST TECHNIQUE: Multidetector CT imaging of the abdomen and pelvis was performed using the standard protocol following bolus administration of intravenous contrast. CONTRAST:  119mL ISOVUE-300 IOPAMIDOL (ISOVUE-300) INJECTION 61% COMPARISON:  05/13/2017 FINDINGS: Lower chest: Clear lung bases. Normal heart size. No pericardial or pleural effusion. Moderate hiatal hernia noted. Hepatobiliary: Scattered numerous varying sized hepatic cysts without significant change in number or distribution. Hepatic and portal veins are patent. No biliary dilatation or obstruction. Gallbladder is collapsed. Common bile duct unremarkable. Pancreas: Unremarkable. No pancreatic ductal dilatation or surrounding inflammatory changes. Spleen: Normal in size without focal abnormality. Accessory splenules noted. Adrenals/Urinary Tract: Adrenal glands are unremarkable. Kidneys are normal, without renal calculi, focal lesion, or hydronephrosis. Bladder is unremarkable. Stomach/Bowel: Negative for bowel obstruction, significant dilatation, ileus, or free air. Normal appendix. Scattered colonic diverticulosis. In the left hemipelvis, there is acute pericolonic strandy edema and inflammation along an inflamed diverticulum compatible with acute focal sigmoid diverticulitis, image 72 series 2. Negative for obstruction, perforation, fluid collection. Vascular/Lymphatic: Abdominal atherosclerosis evident. Negative for aneurysm. Mesenteric and renal vessels remain patent. No adenopathy. Reproductive: Remote hysterectomy.  No adnexal mass. Other: No inguinal or abdominal wall hernia. Musculoskeletal: Degenerative changes. Multiple vacuum disc phenomena. No acute osseous finding or compression fracture. IMPRESSION: Acute focal sigmoid diverticulitis in the left hemipelvis. No associated obstruction, definite perforation, fluid collection or abscess. Electronically Signed   By: Jerilynn Mages.  Shick M.D.   On:  09/09/2017 18:06    Procedures Procedures (including critical care time)  Medications Ordered in ED Medications  iopamidol (ISOVUE-300) 61 % injection 100 mL (100 mLs Intravenous Contrast Given 09/09/17 1728)  metroNIDAZOLE (FLAGYL) tablet 500 mg (500 mg Oral Given 09/09/17 1915)  ciprofloxacin (CIPRO) tablet 500 mg (500 mg Oral Given 09/09/17 1915)     Initial Impression / Assessment and Plan / ED Course  I have reviewed the triage vital signs and the nursing notes.  Pertinent labs & imaging results that were available during my care of the patient were reviewed by me and considered in my medical decision making (see chart for details).    Urine reassuring. No infection.Minimalleukocytosis11.9.CTscanconfirmslimited,local,complicated,sigmoiddiverticulitis.GivenbymouthCiproandFlagyl.DiscussedoftheAntabuseeffectsofFlagyl.Encouragedtotakeaprobiotictwiceperdayatatimedifferentthanheroralmedications.VicodinonlyasneededforpainnotrelievedwithTylenol.Lowresiduedietuntilsymptomsimprovebeen(andresumehigh-fiberdiet.Primarycarefollow-up.ERwithanyacutechanges.  Final Clinical Impressions(s) / ED Diagnoses  Final diagnoses:  Diverticulitis    New Prescriptions New Prescriptions   CIPROFLOXACIN (CIPRO) 500 MG TABLET    Take 1 tablet (500 mg total) by mouth 2 (two) times daily.   HYDROCODONE-ACETAMINOPHEN (NORCO/VICODIN) 5-325 MG TABLET    Take 2 tablets by mouth every 4 (four) hours as needed.   METRONIDAZOLE (FLAGYL) 500 MG TABLET    Take 1 tablet (500 mg total) by mouth 2 (two) times daily.     Tanna Furry, MD 09/09/17 [phone]

## 2017-09-09 NOTE — Discharge Instructions (Signed)
Low fiber, low residue diet until you have improved. Then begin high-fiber diet Antibiotics as prescribed until completed. No alcohol while taking metronidazole/Flagyl. If at any point you have worsening symptoms before completion of treatment, return for reevaluation.

## 2017-10-06 DIAGNOSIS — E785 Hyperlipidemia, unspecified: Secondary | ICD-10-CM | POA: Diagnosis not present

## 2017-10-06 DIAGNOSIS — E039 Hypothyroidism, unspecified: Secondary | ICD-10-CM | POA: Diagnosis not present

## 2017-10-06 DIAGNOSIS — Z23 Encounter for immunization: Secondary | ICD-10-CM | POA: Diagnosis not present

## 2017-10-06 DIAGNOSIS — R05 Cough: Secondary | ICD-10-CM | POA: Diagnosis not present

## 2017-10-06 DIAGNOSIS — I10 Essential (primary) hypertension: Secondary | ICD-10-CM | POA: Diagnosis not present

## 2017-10-06 DIAGNOSIS — F419 Anxiety disorder, unspecified: Secondary | ICD-10-CM | POA: Diagnosis not present

## 2017-12-07 DIAGNOSIS — Z1231 Encounter for screening mammogram for malignant neoplasm of breast: Secondary | ICD-10-CM | POA: Diagnosis not present

## 2018-04-17 ENCOUNTER — Other Ambulatory Visit: Payer: Self-pay

## 2018-04-17 ENCOUNTER — Emergency Department (HOSPITAL_BASED_OUTPATIENT_CLINIC_OR_DEPARTMENT_OTHER): Payer: Medicare Other

## 2018-04-17 ENCOUNTER — Emergency Department (HOSPITAL_BASED_OUTPATIENT_CLINIC_OR_DEPARTMENT_OTHER)
Admission: EM | Admit: 2018-04-17 | Discharge: 2018-04-17 | Disposition: A | Discharge status: Home / Self Care | Payer: Medicare Other | Attending: Emergency Medicine | Admitting: Emergency Medicine

## 2018-04-17 ENCOUNTER — Encounter (HOSPITAL_BASED_OUTPATIENT_CLINIC_OR_DEPARTMENT_OTHER): Payer: Self-pay

## 2018-04-17 DIAGNOSIS — Z79899 Other long term (current) drug therapy: Secondary | ICD-10-CM | POA: Insufficient documentation

## 2018-04-17 DIAGNOSIS — M545 Low back pain, unspecified: Secondary | ICD-10-CM

## 2018-04-17 DIAGNOSIS — I10 Essential (primary) hypertension: Secondary | ICD-10-CM | POA: Diagnosis not present

## 2018-04-17 DIAGNOSIS — E039 Hypothyroidism, unspecified: Secondary | ICD-10-CM | POA: Insufficient documentation

## 2018-04-17 DIAGNOSIS — M5136 Other intervertebral disc degeneration, lumbar region: Secondary | ICD-10-CM | POA: Insufficient documentation

## 2018-04-17 HISTORY — DX: Essential (primary) hypertension: I10

## 2018-04-17 LAB — CBC WITH DIFFERENTIAL/PLATELET
Basophils Absolute: 0.1 10*3/uL (ref 0.0–0.1)
Basophils Relative: 1 %
Eosinophils Absolute: 0.3 10*3/uL (ref 0.0–0.7)
Eosinophils Relative: 5 %
HEMATOCRIT: 34.4 % — AB (ref 36.0–46.0)
HEMOGLOBIN: 12 g/dL (ref 12.0–15.0)
LYMPHS ABS: 1.1 10*3/uL (ref 0.7–4.0)
Lymphocytes Relative: 16 %
MCH: 30.5 pg (ref 26.0–34.0)
MCHC: 34.9 g/dL (ref 30.0–36.0)
MCV: 87.3 fL (ref 78.0–100.0)
MONO ABS: 0.4 10*3/uL (ref 0.1–1.0)
MONOS PCT: 6 %
NEUTROS ABS: 5.2 10*3/uL (ref 1.7–7.7)
NEUTROS PCT: 72 %
Platelets: 304 10*3/uL (ref 150–400)
RBC: 3.94 MIL/uL (ref 3.87–5.11)
RDW: 12.9 % (ref 11.5–15.5)
WBC: 7.2 10*3/uL (ref 4.0–10.5)

## 2018-04-17 LAB — BASIC METABOLIC PANEL
Anion gap: 9 (ref 5–15)
BUN: 15 mg/dL (ref 6–20)
CALCIUM: 8.7 mg/dL — AB (ref 8.9–10.3)
CHLORIDE: 100 mmol/L — AB (ref 101–111)
CO2: 26 mmol/L (ref 22–32)
CREATININE: 0.91 mg/dL (ref 0.44–1.00)
GFR calc Af Amer: >60 mL/min (ref >60)
GFR calc non Af Amer: >60 mL/min (ref >60)
Glucose, Bld: 103 mg/dL — ABNORMAL HIGH (ref 65–99)
Potassium: 3.6 mmol/L (ref 3.5–5.1)
Sodium: 135 mmol/L (ref 135–145)

## 2018-04-17 LAB — URINALYSIS, ROUTINE W REFLEX MICROSCOPIC
BILIRUBIN URINE: NEGATIVE
GLUCOSE, UA: NEGATIVE mg/dL
HGB URINE DIPSTICK: NEGATIVE
Ketones, ur: NEGATIVE mg/dL
Leukocytes, UA: NEGATIVE
Nitrite: NEGATIVE
PH: 6.5 (ref 5.0–8.0)
Protein, ur: NEGATIVE mg/dL

## 2018-04-17 MED ORDER — HYDROCODONE-ACETAMINOPHEN 5-325 MG PO TABS: 1.0000 | ORAL_TABLET | ORAL | 0 refills | Status: DC | PRN

## 2018-04-17 MED ORDER — KETOROLAC TROMETHAMINE 30 MG/ML IJ SOLN
30.0000 mg | Freq: Once | INTRAMUSCULAR | Status: AC
  Administered 2018-04-17: 30 mg via INTRAVENOUS
  Filled 2018-04-17: qty 1

## 2018-04-17 MED ORDER — DICLOFENAC SODIUM 1 % TD GEL: 2.0000 g | Freq: Four times a day (QID) | TRANSDERMAL | 0 refills | Status: DC

## 2018-04-17 MED ORDER — CYCLOBENZAPRINE HCL 5 MG PO TABS: 5.0000 mg | ORAL_TABLET | Freq: Three times a day (TID) | ORAL | 0 refills | Status: DC | PRN

## 2018-04-17 MED ORDER — HYDROCODONE-ACETAMINOPHEN 5-325 MG PO TABS
1.0000 | ORAL_TABLET | Freq: Once | ORAL | Status: AC
  Administered 2018-04-17: 1 via ORAL
  Filled 2018-04-17: qty 1

## 2018-04-17 MED ORDER — CYCLOBENZAPRINE HCL 5 MG PO TABS
5.0000 mg | ORAL_TABLET | Freq: Once | ORAL | Status: AC
  Administered 2018-04-17: 5 mg via ORAL
  Filled 2018-04-17: qty 1

## 2018-04-17 NOTE — ED Provider Notes (Signed)
[place] EMERGENCY DEPARTMENT Provider Note   CSN: [id number] Arrival date & time: 04/17/18  0855     History   Chief Complaint Chief Complaint  Patient presents with  . Back Pain    HPI [name] is a 72 y.o. female hx of diveritculitis, HTN, lumbar disc disease here with back pain, flank pain. Patient states that she has progressively worse L flank and lower back pain for the last 4 days.  She states that she occasionally gets some muscle spasms associated with it.  Patient denies pain radiating down her legs or any paresthesias or weakness in the legs.  Denies any trouble urinating or trouble walking. She had some left over vicodin and she took it and it helped minimally. She denies any blood in urine and states that this is not similar to previous kidney stone. Patient does have lumbar disc disease follows up with Dr. Lynann Bologna. She had steroid injection about a year ago that helped her symptoms. She finished a course of physical therapy several months for her back. Denies fever or trauma or fall.   The history is provided by the patient.    Past Medical History:  Diagnosis Date  . Diverticulosis   . Hypertension   . Hypothyroidism   . OCD (obsessive compulsive disorder)   . Osteoarthritis   . Osteoporosis     Patient Active Problem List   Diagnosis Date Noted  . S/P right UKR 06/07/2012    Past Surgical History:  Procedure Laterality Date  . ABDOMINAL HYSTERECTOMY     partial then complete   . BUNIONECTOMY     bunion surgery and hammer surgery x 2   . HAMMER TOE SURGERY     left foot-2012  . KNEE ARTHROSCOPY     right   . PARTIAL KNEE ARTHROPLASTY  06/07/2012   Procedure: UNICOMPARTMENTAL KNEE;  Surgeon: Mauri Pole, MD;  Location: [facility];  Service: Orthopedics;  Laterality: Right;     OB History   None      Home Medications    Prior to Admission medications   Medication Sig Start Date End Date Taking? Authorizing Provider    amLODipine (NORVASC) 5 MG tablet Take 5 mg by mouth daily.   Yes [provider]  Calcium Carbonate-Vitamin D (CALCIUM + D) 600-200 MG-UNIT TABS Take 1 tablet by mouth daily.   Yes [provider]  cholecalciferol (VITAMIN D) 1000 UNITS tablet Take 2,000 Units by mouth daily. Takes 2 tablets daily   Yes [provider]  co-enzyme Q-10 30 MG capsule Take 30 mg by mouth 3 (three) times daily.   Yes [provider]  diphenhydramine-acetaminophen (TYLENOL PM) 25-500 MG TABS tablet Take 1 tablet by mouth at bedtime as needed.   Yes [provider]  Docusate Sodium (COLACE PO) Take by mouth.   Yes [provider]  ferrous sulfate 325 (65 FE) MG tablet Take 1 tablet (325 mg total) by mouth 3 (three) times daily after meals. 06/08/12 04/17/18 Yes Babish, Rodman Key, PA-C  levothyroxine (SYNTHROID, LEVOTHROID) 112 MCG tablet Take by mouth daily before breakfast.    Yes [provider]  meloxicam (MOBIC) 7.5 MG tablet Take 7.5 mg by mouth daily as needed for pain.   Yes [provider]  pilocarpine (PILOCAR) 1 % ophthalmic solution Place 1 drop into both eyes as needed.   Yes [provider]  venlafaxine XR (EFFEXOR-XR) 75 MG 24 hr capsule Take 75 mg by mouth  daily.   Yes [provider]  vitamin C (ASCORBIC ACID) 500 MG tablet Take 500 mg by mouth daily.   Yes [provider]  ciprofloxacin (CIPRO) 500 MG tablet Take 1 tablet (500 mg total) by mouth 2 (two) times daily. 09/09/17   Tanna Furry, MD  diphenhydrAMINE (BENADRYL) 25 mg capsule Take 1 capsule (25 mg total) by mouth every 6 (six) hours as needed for itching, allergies or sleep. 06/08/12 06/18/12  Danae Orleans, PA-C  estradiol (ESTRACE) 0.5 MG tablet Take 1 mg by mouth See admin instructions. Take for 3 weeks then off for 1 week.Then repeat.    [provider]  HYDROcodone-acetaminophen (NORCO/VICODIN) 5-325 MG tablet Take 2 tablets by mouth every 4  (four) hours as needed. 09/09/17   Tanna Furry, MD  HYDROcodone-acetaminophen (NORCO/VICODIN) 5-325 MG tablet Take 1-2 tablets by mouth every 4 (four) hours as needed. 09/09/17   Tanna Furry, MD  levothyroxine (SYNTHROID, LEVOTHROID) 88 MCG tablet Take 88 mcg by mouth daily.    [provider]  metroNIDAZOLE (FLAGYL) 500 MG tablet Take 1 tablet (500 mg total) by mouth 2 (two) times daily. 09/09/17   Tanna Furry, MD  Red Yeast Rice 600 MG CAPS Take 1 capsule by mouth daily.    [provider]  traMADol (ULTRAM) 50 MG tablet Take 1 tablet (50 mg total) by mouth every 6 (six) hours as needed. 05/21/17   Willia Craze, NP  vitamin A 8000 UNIT capsule Take 8,000 Units by mouth daily.    [provider]    Family History Family History  Problem Relation Age of Onset  . Stroke Mother   . Diabetes Mother   . Prostate cancer Father   . Heart disease Father   . Diabetes Father   . Diabetes Maternal Aunt   . Colon cancer Neg Hx   . Stomach cancer Neg Hx     Social History Social History   Tobacco Use  . Smoking status: Never Smoker  . Smokeless tobacco: Never Used  Substance Use Topics  . Alcohol use: Yes    Comment: rare  . Drug use: No     Allergies   Prednisone and Sulfonamide derivatives   Review of Systems Review of Systems  Musculoskeletal: Positive for back pain.  All other systems reviewed and are negative.    Physical Exam Updated Vital Signs BP 131/82 (BP Location: Left Arm)   Pulse 98   Temp 98.1 F (36.7 C) (Oral)   Resp 18   Ht 5\' 1"  (1.549 m)   Wt 68 kg (150 lb)   SpO2 98%   BMI 28.34 kg/m   Physical Exam  Constitutional: She is oriented to person, place, and time.  Slightly uncomfortable   HENT:  Head: Normocephalic.  Mouth/Throat: Oropharynx is clear and moist.  Eyes: Pupils are equal, round, and reactive to light. Conjunctivae and EOM are normal.  Neck: Normal range of motion. Neck supple.  Cardiovascular: Normal  rate, regular rhythm and normal heart sounds.  Pulmonary/Chest: Effort normal and breath sounds normal. No stridor. No respiratory distress.  Abdominal: Soft. Bowel sounds are normal. She exhibits no distension. There is no tenderness.  Musculoskeletal:  Mild L CVAT vs paralumbar tenderness, no obvious midline tenderness. Nl ROM bilateral hips   Neurological: She is alert and oriented to person, place, and time.  No saddle anesthesia, neurovascular intact in bilateral lower extremities   Skin: Skin is warm.  Psychiatric: She has a normal mood and  affect.  Nursing note and vitals reviewed.    ED Treatments / Results  Labs (all labs ordered are listed, but only abnormal results are displayed) Labs Reviewed  CBC WITH DIFFERENTIAL/PLATELET - Abnormal; Notable for the following components:      Result Value   HCT 34.4 (*)    All other components within normal limits  BASIC METABOLIC PANEL - Abnormal; Notable for the following components:   Chloride 100 (*)    Glucose, Bld 103 (*)    Calcium 8.7 (*)    All other components within normal limits  URINALYSIS, ROUTINE W REFLEX MICROSCOPIC - Abnormal; Notable for the following components:   Specific Gravity, Urine <1.005 (*)    All other components within normal limits    EKG None  Radiology Dg Lumbar Spine Complete  Result Date: 04/17/2018 CLINICAL DATA:  Recurrent chronic left-sided low back pain. EXAM: LUMBAR SPINE - COMPLETE 4+ VIEW COMPARISON:  Abdomen pelvis CT on 09/09/2017 FINDINGS: There is no evidence of lumbar spine fracture. Alignment is normal. Moderate degenerative disc disease is seen from levels of L2-S1, with vacuum disc phenomenon. Moderate to severe facet DJD is seen on the left at L4-5 which shows progression since prior study. Moderate right-sided facet DJD at L5-S1 also shows interval progression. No focal lytic or sclerotic bone lesions identified. IMPRESSION: No acute findings. Degenerative spondylosis, with  progressive lower lumbar facet DJD, left side greater than right. Electronically Signed   By: Earle Gell M.D.   On: 04/17/2018 10:07    Procedures Procedures (including critical care time)  Medications Ordered in ED Medications  HYDROcodone-acetaminophen (NORCO/VICODIN) 5-325 MG per tablet 1 tablet (1 tablet Oral Given 04/17/18 0935)  ketorolac (TORADOL) 30 MG/ML injection 30 mg (30 mg Intravenous Given 04/17/18 0935)  cyclobenzaprine (FLEXERIL) tablet 5 mg (5 mg Oral Given 04/17/18 0935)     Initial Impression / Assessment and Plan / ED Course  I have reviewed the triage vital signs and the nursing notes.  Pertinent labs & imaging results that were available during my care of the patient were reviewed by me and considered in my medical decision making (see chart for details).     TARANEH METHENEY is a 72 y.o. female here with back pain. Hx of low back pain that required steroid injection and physical therapy. No new injury or fall, neurovascular intact. Consider worsening of chronic back pain vs spasms vs renal colic. Will get xrays, UA, basic labs. Will give NSAIDs, muscle relaxants.   10:27 AM Labs and UA unremarkable. Xray showed degenerative disc disease. Pain improved with toradol, flexeril, vicodin. Will dc home with flexeril, several doses of vicodin. She has ortho follow up.   Final Clinical Impressions(s) / ED Diagnoses   Final diagnoses:  None    ED Discharge Orders    None       Drenda Freeze, MD 04/17/18 1028

## 2018-04-17 NOTE — ED Triage Notes (Addendum)
Pt reports recurrent muscle spasm in left lower back/flank area - pain radiates up center of back. Reports similar issues one year ago - with MRI/PT/Injections. Associated nausea. Pain worsened one week ago. Pt denies that pain radiates distally, denies numbness, incontinence, or known injury.

## 2018-04-17 NOTE — Discharge Instructions (Addendum)
Continue taking alleve.   Take flexeril for muscle spasms.  You can try voltaren gel to the back to help with pain.   If you have persistent pain, take vicodin as needed. Do NOT drive with it.   Call your orthopedic doctor next week if you still have pain   Return to ER if you have uncontrolled pain, weakness or numbness if you legs, trouble urinating, unable to walk, fever

## 2018-04-27 DIAGNOSIS — M545 Low back pain: Secondary | ICD-10-CM | POA: Diagnosis not present

## 2018-07-15 DIAGNOSIS — H0102B Squamous blepharitis left eye, upper and lower eyelids: Secondary | ICD-10-CM | POA: Diagnosis not present

## 2018-07-15 DIAGNOSIS — H52213 Irregular astigmatism, bilateral: Secondary | ICD-10-CM | POA: Diagnosis not present

## 2018-07-15 DIAGNOSIS — H04123 Dry eye syndrome of bilateral lacrimal glands: Secondary | ICD-10-CM | POA: Diagnosis not present

## 2018-07-15 DIAGNOSIS — H0102A Squamous blepharitis right eye, upper and lower eyelids: Secondary | ICD-10-CM | POA: Diagnosis not present

## 2018-07-21 ENCOUNTER — Ambulatory Visit: Payer: Medicare Other | Admitting: Gastroenterology

## 2018-10-21 DIAGNOSIS — E039 Hypothyroidism, unspecified: Secondary | ICD-10-CM | POA: Diagnosis not present

## 2018-10-21 DIAGNOSIS — Z23 Encounter for immunization: Secondary | ICD-10-CM | POA: Diagnosis not present

## 2018-10-21 DIAGNOSIS — F419 Anxiety disorder, unspecified: Secondary | ICD-10-CM | POA: Diagnosis not present

## 2018-10-21 DIAGNOSIS — I10 Essential (primary) hypertension: Secondary | ICD-10-CM | POA: Diagnosis not present

## 2018-11-22 ENCOUNTER — Encounter (HOSPITAL_BASED_OUTPATIENT_CLINIC_OR_DEPARTMENT_OTHER): Payer: Self-pay | Admitting: Emergency Medicine

## 2018-11-22 ENCOUNTER — Other Ambulatory Visit: Payer: Self-pay

## 2018-11-22 ENCOUNTER — Emergency Department (HOSPITAL_BASED_OUTPATIENT_CLINIC_OR_DEPARTMENT_OTHER): Payer: Medicare Other

## 2018-11-22 ENCOUNTER — Emergency Department (HOSPITAL_BASED_OUTPATIENT_CLINIC_OR_DEPARTMENT_OTHER)
Admission: EM | Admit: 2018-11-22 | Discharge: 2018-11-22 | Disposition: A | Discharge status: Home / Self Care | Payer: Medicare Other | Attending: Emergency Medicine | Admitting: Emergency Medicine

## 2018-11-22 DIAGNOSIS — E860 Dehydration: Secondary | ICD-10-CM | POA: Diagnosis not present

## 2018-11-22 DIAGNOSIS — E876 Hypokalemia: Secondary | ICD-10-CM | POA: Diagnosis not present

## 2018-11-22 DIAGNOSIS — N12 Tubulo-interstitial nephritis, not specified as acute or chronic: Secondary | ICD-10-CM | POA: Insufficient documentation

## 2018-11-22 DIAGNOSIS — K5730 Diverticulosis of large intestine without perforation or abscess without bleeding: Secondary | ICD-10-CM | POA: Diagnosis not present

## 2018-11-22 DIAGNOSIS — I10 Essential (primary) hypertension: Secondary | ICD-10-CM | POA: Diagnosis not present

## 2018-11-22 DIAGNOSIS — K449 Diaphragmatic hernia without obstruction or gangrene: Secondary | ICD-10-CM | POA: Diagnosis not present

## 2018-11-22 DIAGNOSIS — Z79899 Other long term (current) drug therapy: Secondary | ICD-10-CM | POA: Diagnosis not present

## 2018-11-22 DIAGNOSIS — R6883 Chills (without fever): Secondary | ICD-10-CM | POA: Diagnosis present

## 2018-11-22 LAB — COMPREHENSIVE METABOLIC PANEL
ALT: 21 U/L (ref 0–44)
AST: 28 U/L (ref 15–41)
Albumin: 3.2 g/dL — ABNORMAL LOW (ref 3.5–5.0)
Alkaline Phosphatase: 65 U/L (ref 38–126)
Anion gap: 12 (ref 5–15)
BUN: 14 mg/dL (ref 8–23)
CO2: 23 mmol/L (ref 22–32)
Calcium: 8.4 mg/dL — ABNORMAL LOW (ref 8.9–10.3)
Chloride: 97 mmol/L — ABNORMAL LOW (ref 98–111)
Creatinine, Ser: 1.07 mg/dL — ABNORMAL HIGH (ref 0.44–1.00)
GFR calc non Af Amer: 52 mL/min — ABNORMAL LOW (ref >60)
Glucose, Bld: 160 mg/dL — ABNORMAL HIGH (ref 70–99)
Potassium: 2.8 mmol/L — ABNORMAL LOW (ref 3.5–5.1)
SODIUM: 132 mmol/L — AB (ref 135–145)
Total Bilirubin: 0.7 mg/dL (ref 0.3–1.2)
Total Protein: 6.6 g/dL (ref 6.5–8.1)

## 2018-11-22 LAB — URINALYSIS, MICROSCOPIC (REFLEX): WBC, UA: >50 WBC/hpf (ref 0–5)

## 2018-11-22 LAB — CBC WITH DIFFERENTIAL/PLATELET
Abs Immature Granulocytes: 0.04 10*3/uL (ref 0.00–0.07)
BASOS ABS: 0 10*3/uL (ref 0.0–0.1)
Basophils Relative: 0 %
EOS ABS: 0.1 10*3/uL (ref 0.0–0.5)
Eosinophils Relative: 1 %
HCT: 35.1 % — ABNORMAL LOW (ref 36.0–46.0)
Hemoglobin: 11.7 g/dL — ABNORMAL LOW (ref 12.0–15.0)
Immature Granulocytes: 0 %
Lymphocytes Relative: 7 %
Lymphs Abs: 0.6 10*3/uL — ABNORMAL LOW (ref 0.7–4.0)
MCH: 28.9 pg (ref 26.0–34.0)
MCHC: 33.3 g/dL (ref 30.0–36.0)
MCV: 86.7 fL (ref 80.0–100.0)
Monocytes Absolute: 0.6 10*3/uL (ref 0.1–1.0)
Monocytes Relative: 7 %
NRBC: 0 % (ref 0.0–0.2)
Neutro Abs: 7.6 10*3/uL (ref 1.7–7.7)
Neutrophils Relative %: 85 %
Platelets: 221 10*3/uL (ref 150–400)
RBC: 4.05 MIL/uL (ref 3.87–5.11)
RDW: 12.7 % (ref 11.5–15.5)
WBC: 9 10*3/uL (ref 4.0–10.5)

## 2018-11-22 LAB — URINALYSIS, ROUTINE W REFLEX MICROSCOPIC
Bilirubin Urine: NEGATIVE
Glucose, UA: NEGATIVE mg/dL
KETONES UR: 40 mg/dL — AB
Nitrite: POSITIVE — AB
Protein, ur: 100 mg/dL — AB
Specific Gravity, Urine: 1.02 (ref 1.005–1.030)
pH: 5.5 (ref 5.0–8.0)

## 2018-11-22 LAB — I-STAT CG4 LACTIC ACID, ED: Lactic Acid, Venous: 1.07 mmol/L (ref 0.5–1.9)

## 2018-11-22 MED ORDER — POTASSIUM CHLORIDE CRYS ER 20 MEQ PO TBCR
40.0000 meq | EXTENDED_RELEASE_TABLET | Freq: Once | ORAL | Status: AC
  Administered 2018-11-22: 40 meq via ORAL
  Filled 2018-11-22: qty 2

## 2018-11-22 MED ORDER — SODIUM CHLORIDE 0.9 % IV BOLUS
1000.0000 mL | Freq: Once | INTRAVENOUS | Status: AC
  Administered 2018-11-22: 1000 mL via INTRAVENOUS

## 2018-11-22 MED ORDER — ONDANSETRON 4 MG PO TBDP: 4.0000 mg | ORAL_TABLET | Freq: Three times a day (TID) | ORAL | 0 refills | Status: DC | PRN

## 2018-11-22 MED ORDER — POTASSIUM CHLORIDE ER 20 MEQ PO TBCR: 10.0000 meq | EXTENDED_RELEASE_TABLET | Freq: Every day | ORAL | 0 refills | Status: DC

## 2018-11-22 MED ORDER — LACTATED RINGERS IV BOLUS
1000.0000 mL | Freq: Once | INTRAVENOUS | Status: AC
  Administered 2018-11-22: 1000 mL via INTRAVENOUS

## 2018-11-22 MED ORDER — SODIUM CHLORIDE 0.9 % IV SOLN
1.0000 g | Freq: Once | INTRAVENOUS | Status: AC
  Administered 2018-11-22: 1 g via INTRAVENOUS
  Filled 2018-11-22: qty 10

## 2018-11-22 MED ORDER — ONDANSETRON HCL 4 MG/2ML IJ SOLN
4.0000 mg | Freq: Once | INTRAMUSCULAR | Status: AC
  Administered 2018-11-22: 4 mg via INTRAVENOUS
  Filled 2018-11-22: qty 2

## 2018-11-22 MED ORDER — CEPHALEXIN 500 MG PO CAPS: 500.0000 mg | ORAL_CAPSULE | Freq: Two times a day (BID) | ORAL | 0 refills | Status: DC

## 2018-11-22 MED ORDER — IOPAMIDOL (ISOVUE-300) INJECTION 61%
100.0000 mL | Freq: Once | INTRAVENOUS | Status: AC | PRN
  Administered 2018-11-22: 100 mL via INTRAVENOUS

## 2018-11-22 NOTE — ED Notes (Signed)
RN and MD at bedside.

## 2018-11-22 NOTE — ED Provider Notes (Signed)
[facility] EMERGENCY DEPARTMENT Provider Note   CSN: [id number] Arrival date & time: 11/22/18  1446     History   Chief Complaint No chief complaint on file.   HPI [name] is a 72 y.o. female.  The history is provided by the patient. No language interpreter was used.   [name] is a 72 y.o. female who presents to the Emergency Department complaining of chills. Since to the emergency department for evaluation of chills and vomiting. Symptoms began promptly late Friday night at midnight when she awoke from sleep with shaking chills that lasted about 45 minutes. Since that time she has been experiencing intermittent chills with wrenching sweats. Yesterday she developed numerous episodes of emesis with associated generalized abdominal discomfort. She has associated constipation. No sore throat, cough, shortness of breath, dysuria, diarrhea. She has a history of diverticulitis but this is significantly different from her prior episodes. She reports a temperature to 98.6 at home, which is high for her. Past Medical History:  Diagnosis Date  . Diverticulosis   . Hypertension   . Hypothyroidism   . OCD (obsessive compulsive disorder)   . Osteoarthritis   . Osteoporosis     Patient Active Problem List   Diagnosis Date Noted  . S/P right UKR 06/07/2012    Past Surgical History:  Procedure Laterality Date  . ABDOMINAL HYSTERECTOMY     partial then complete   . BUNIONECTOMY     bunion surgery and hammer surgery x 2   . HAMMER TOE SURGERY     left foot-2012  . KNEE ARTHROSCOPY     right   . PARTIAL KNEE ARTHROPLASTY  06/07/2012   Procedure: UNICOMPARTMENTAL KNEE;  Surgeon: Mauri Pole, MD;  Location: [facility];  Service: Orthopedics;  Laterality: Right;     OB History   None      Home Medications    Prior to Admission medications   Medication Sig Start Date End Date Taking? Authorizing Provider  amLODipine (NORVASC) 5 MG tablet Take 5 mg by mouth  daily.    [provider]  Calcium Carbonate-Vitamin D (CALCIUM + D) 600-200 MG-UNIT TABS Take 1 tablet by mouth daily.    [provider]  cephALEXin (KEFLEX) 500 MG capsule Take 1 capsule (500 mg total) by mouth 2 (two) times daily. 11/22/18   Quintella Reichert, MD  cholecalciferol (VITAMIN D) 1000 UNITS tablet Take 2,000 Units by mouth daily. Takes 2 tablets daily    [provider]  ciprofloxacin (CIPRO) 500 MG tablet Take 1 tablet (500 mg total) by mouth 2 (two) times daily. 09/09/17   Tanna Furry, MD  co-enzyme Q-10 30 MG capsule Take 30 mg by mouth 3 (three) times daily.    [provider]  cyclobenzaprine (FLEXERIL) 5 MG tablet Take 1 tablet (5 mg total) by mouth 3 (three) times daily as needed for muscle spasms. 04/17/18   Drenda Freeze, MD  diclofenac sodium (VOLTAREN) 1 % GEL Apply 2 g topically 4 (four) times daily. 04/17/18   Drenda Freeze, MD  diphenhydrAMINE (BENADRYL) 25 mg capsule Take 1 capsule (25 mg total) by mouth every 6 (six) hours as needed for itching, allergies or sleep. 06/08/12 06/18/12  Danae Orleans, PA-C  diphenhydramine-acetaminophen (TYLENOL PM) 25-500 MG TABS tablet Take 1 tablet by mouth at bedtime as needed.    [provider]  Docusate Sodium (COLACE PO) Take by mouth.    [provider]  estradiol (ESTRACE) 0.5  MG tablet Take 1 mg by mouth See admin instructions. Take for 3 weeks then off for 1 week.Then repeat.    [provider]  ferrous sulfate 325 (65 FE) MG tablet Take 1 tablet (325 mg total) by mouth 3 (three) times daily after meals. 06/08/12 04/17/18  Danae Orleans, PA-C  HYDROcodone-acetaminophen (NORCO/VICODIN) 5-325 MG tablet Take 1 tablet by mouth every 4 (four) hours as needed. 04/17/18   Drenda Freeze, MD  levothyroxine (SYNTHROID, LEVOTHROID) 112 MCG tablet Take by mouth daily before breakfast.     [provider]  levothyroxine (SYNTHROID, LEVOTHROID) 88 MCG tablet Take  88 mcg by mouth daily.    [provider]  meloxicam (MOBIC) 7.5 MG tablet Take 7.5 mg by mouth daily as needed for pain.    [provider]  metroNIDAZOLE (FLAGYL) 500 MG tablet Take 1 tablet (500 mg total) by mouth 2 (two) times daily. 09/09/17   Tanna Furry, MD  ondansetron (ZOFRAN ODT) 4 MG disintegrating tablet Take 1 tablet (4 mg total) by mouth every 8 (eight) hours as needed for nausea or vomiting. 11/22/18   Quintella Reichert, MD  pilocarpine (PILOCAR) 1 % ophthalmic solution Place 1 drop into both eyes as needed.    [provider]  potassium chloride 20 MEQ TBCR Take 10 mEq by mouth daily. 11/22/18   Quintella Reichert, MD  Red Yeast Rice 600 MG CAPS Take 1 capsule by mouth daily.    [provider]  traMADol (ULTRAM) 50 MG tablet Take 1 tablet (50 mg total) by mouth every 6 (six) hours as needed. 05/21/17   Willia Craze, NP  venlafaxine XR (EFFEXOR-XR) 75 MG 24 hr capsule Take 75 mg by mouth daily.    [provider]  vitamin A 8000 UNIT capsule Take 8,000 Units by mouth daily.    [provider]  vitamin C (ASCORBIC ACID) 500 MG tablet Take 500 mg by mouth daily.    [provider]    Family History Family History  Problem Relation Age of Onset  . Stroke Mother   . Diabetes Mother   . Prostate cancer Father   . Heart disease Father   . Diabetes Father   . Diabetes Maternal Aunt   . Colon cancer Neg Hx   . Stomach cancer Neg Hx     Social History Social History   Tobacco Use  . Smoking status: Never Smoker  . Smokeless tobacco: Never Used  Substance Use Topics  . Alcohol use: Yes    Comment: rare  . Drug use: No     Allergies   Prednisone and Sulfonamide derivatives   Review of Systems Review of Systems  All other systems reviewed and are negative.    Physical Exam Updated Vital Signs BP 126/74 (BP Location: Left Arm)   Pulse 80   Temp 98 F (36.7 C) (Oral)   Resp 20   Ht 5\' 1"  (1.549 m)    Wt 66.7 kg   SpO2 95%   BMI 27.78 kg/m   Physical Exam  Constitutional: She is oriented to person, place, and time. She appears well-developed and well-nourished.  HENT:  Head: Normocephalic and atraumatic.  Cardiovascular: Regular rhythm.  No murmur heard. Tachycardic  Pulmonary/Chest: Effort normal and breath sounds normal. No respiratory distress.  Abdominal: Soft. There is no rebound.  Moderate lower abdominal tenderness, greatest over the right lower quadrant  Musculoskeletal: She exhibits no edema or tenderness.  Neurological: She is alert and oriented  to person, place, and time.  Skin: Skin is warm and dry.  Psychiatric: She has a normal mood and affect. Her behavior is normal.  Nursing note and vitals reviewed.    ED Treatments / Results  Labs (all labs ordered are listed, but only abnormal results are displayed) Labs Reviewed  COMPREHENSIVE METABOLIC PANEL - Abnormal; Notable for the following components:      Result Value   Sodium 132 (*)    Potassium 2.8 (*)    Chloride 97 (*)    Glucose, Bld 160 (*)    Creatinine, Ser 1.07 (*)    Calcium 8.4 (*)    Albumin 3.2 (*)    GFR calc non Af Amer 52 (*)    All other components within normal limits  CBC WITH DIFFERENTIAL/PLATELET - Abnormal; Notable for the following components:   Hemoglobin 11.7 (*)    HCT 35.1 (*)    Lymphs Abs 0.6 (*)    All other components within normal limits  URINALYSIS, ROUTINE W REFLEX MICROSCOPIC - Abnormal; Notable for the following components:   APPearance CLOUDY (*)    Hgb urine dipstick LARGE (*)    Ketones, ur 40 (*)    Protein, ur 100 (*)    Nitrite POSITIVE (*)    Leukocytes, UA MODERATE (*)    All other components within normal limits  URINALYSIS, MICROSCOPIC (REFLEX) - Abnormal; Notable for the following components:   Bacteria, UA MANY (*)    All other components within normal limits  CULTURE, BLOOD (ROUTINE X 2)  CULTURE, BLOOD (ROUTINE X 2)  I-STAT CG4 LACTIC ACID, ED    I-STAT CG4 LACTIC ACID, ED    EKG EKG Interpretation  Date/Time:  Monday November 22 2018 15:38:35 EST Ventricular Rate:  89 PR Interval:    QRS Duration: 109 QT Interval:  382 QTC Calculation: 465 R Axis:   52 Text Interpretation:  Sinus rhythm Low voltage, precordial leads Confirmed by Quintella Reichert [phone]) on 11/22/2018 3:44:54 PM   Radiology Ct Abdomen Pelvis W Contrast  Result Date: 11/22/2018 CLINICAL DATA:  Left lower quadrant pain, fever, and nausea and vomiting for 3 days. EXAM: CT ABDOMEN AND PELVIS WITH CONTRAST TECHNIQUE: Multidetector CT imaging of the abdomen and pelvis was performed using the standard protocol following bolus administration of intravenous contrast. CONTRAST:  17mL ISOVUE-300 IOPAMIDOL (ISOVUE-300) INJECTION 61% COMPARISON:  09/09/2017 FINDINGS: Lower Chest: No acute findings. Hepatobiliary: Multiple hepatic cysts are again seen. No hepatic masses identified. Gallbladder is unremarkable. Pancreas:  No mass or inflammatory changes. Spleen: Within normal limits in size and appearance. Adrenals/Urinary Tract: Normal adrenal glands and left kidney. Mild right renal swelling and diffuse heterogeneous contrast enhancement is consistent with pyelonephritis. No evidence of renal mass or abscess. No evidence of ureteral calculi or hydronephrosis. Stomach/Bowel: Moderate hiatal hernia again seen. No evidence of obstruction, inflammatory process or abnormal fluid collections. Diverticulosis is seen mainly involving the sigmoid colon, however there is no evidence of diverticulitis. Vascular/Lymphatic: No pathologically enlarged lymph nodes. No abdominal aortic aneurysm. Aortic atherosclerosis. Reproductive: Prior hysterectomy noted. Adnexal regions are unremarkable in appearance. Other:  None. Musculoskeletal:  No suspicious bone lesions identified. IMPRESSION: Findings consistent with pyelonephritis of the right kidney. No evidence of renal abscess or hydronephrosis.  Colonic diverticulosis, without radiographic evidence of diverticulitis. Moderate hiatal hernia. Electronically Signed   By: Earle Gell M.D.   On: 11/22/2018 17:00    Procedures Procedures (including critical care time)  Medications Ordered in ED Medications  sodium chloride  0.9 % bolus 1,000 mL (0 mLs Intravenous Stopped 11/22/18 1720)  ondansetron (ZOFRAN) injection 4 mg (4 mg Intravenous Given 11/22/18 1723)  iopamidol (ISOVUE-300) 61 % injection 100 mL (100 mLs Intravenous Contrast Given 11/22/18 1636)  cefTRIAXone (ROCEPHIN) 1 g in sodium chloride 0.9 % 100 mL IVPB (1 g Intravenous New Bag/Given 11/22/18 1721)  potassium chloride SA (K-DUR,KLOR-CON) CR tablet 40 mEq (40 mEq Oral Given 11/22/18 1726)  lactated ringers bolus 1,000 mL (1,000 mLs Intravenous New Bag/Given 11/22/18 1721)     Initial Impression / Assessment and Plan / ED Course  I have reviewed the triage vital signs and the nursing notes.  Pertinent labs & imaging results that were available during my care of the patient were reviewed by me and considered in my medical decision making (see chart for details).     Patient with history of diverticulitis here for evaluation of chills, nausea, vomiting. She does have some lower abdominal tenderness on examination without peritoneal findings. CT abdomen and pelvis was obtained, was negative for appendicitis. CT and urinalysis are consistent with pyelonephritis. She is non-toxic appearing on examination and tolerating oral fluids. Potassium is mildly low, will replace. Discussed with patient home care for pyelonephritis. Discussed importance of oral fluid hydration, continuing antibiotics and antiemetics. Discussed outpatient follow-up and return precautions.  Final Clinical Impressions(s) / ED Diagnoses   Final diagnoses:  Pyelonephritis of right kidney  Hypokalemia  Dehydration    ED Discharge Orders         Ordered    cephALEXin (KEFLEX) 500 MG capsule  2 times daily      11/22/18 1749    ondansetron (ZOFRAN ODT) 4 MG disintegrating tablet  Every 8 hours PRN     11/22/18 1749    potassium chloride 20 MEQ TBCR  Daily     11/22/18 1749           Quintella Reichert, MD 11/22/18 [phone]

## 2018-11-22 NOTE — ED Triage Notes (Signed)
Reports chills since Friday night.  C/o lethargy and vomiting with decreased appetite.  States she has been craving "coke" and has been able to keep that down.

## 2018-11-24 LAB — BLOOD CULTURE ID PANEL (REFLEXED)
Acinetobacter baumannii: NOT DETECTED
CANDIDA KRUSEI: NOT DETECTED
Candida albicans: NOT DETECTED
Candida glabrata: NOT DETECTED
Candida parapsilosis: NOT DETECTED
Candida tropicalis: NOT DETECTED
Carbapenem resistance: NOT DETECTED
ENTEROBACTER CLOACAE COMPLEX: NOT DETECTED
Enterobacteriaceae species: DETECTED — AB
Enterococcus species: NOT DETECTED
Escherichia coli: DETECTED — AB
Haemophilus influenzae: NOT DETECTED
KLEBSIELLA OXYTOCA: NOT DETECTED
Klebsiella pneumoniae: NOT DETECTED
Listeria monocytogenes: NOT DETECTED
Neisseria meningitidis: NOT DETECTED
Proteus species: NOT DETECTED
Pseudomonas aeruginosa: NOT DETECTED
Serratia marcescens: NOT DETECTED
Staphylococcus aureus (BCID): NOT DETECTED
Staphylococcus species: NOT DETECTED
Streptococcus agalactiae: NOT DETECTED
Streptococcus pneumoniae: NOT DETECTED
Streptococcus pyogenes: NOT DETECTED
Streptococcus species: NOT DETECTED

## 2018-11-25 ENCOUNTER — Observation Stay (HOSPITAL_COMMUNITY)
Admission: EM | Admit: 2018-11-25 | Discharge: 2018-11-26 | Disposition: A | Discharge status: Home / Self Care | Payer: Medicare Other | Attending: Internal Medicine | Admitting: Internal Medicine

## 2018-11-25 ENCOUNTER — Telehealth (HOSPITAL_BASED_OUTPATIENT_CLINIC_OR_DEPARTMENT_OTHER): Payer: Self-pay | Admitting: *Deleted

## 2018-11-25 ENCOUNTER — Telehealth (HOSPITAL_BASED_OUTPATIENT_CLINIC_OR_DEPARTMENT_OTHER): Payer: Self-pay | Admitting: Emergency Medicine

## 2018-11-25 ENCOUNTER — Encounter (HOSPITAL_COMMUNITY): Payer: Self-pay

## 2018-11-25 ENCOUNTER — Other Ambulatory Visit: Payer: Self-pay

## 2018-11-25 DIAGNOSIS — Z888 Allergy status to other drugs, medicaments and biological substances status: Secondary | ICD-10-CM | POA: Insufficient documentation

## 2018-11-25 DIAGNOSIS — R7881 Bacteremia: Secondary | ICD-10-CM | POA: Diagnosis not present

## 2018-11-25 DIAGNOSIS — N12 Tubulo-interstitial nephritis, not specified as acute or chronic: Secondary | ICD-10-CM | POA: Diagnosis not present

## 2018-11-25 DIAGNOSIS — E039 Hypothyroidism, unspecified: Secondary | ICD-10-CM | POA: Insufficient documentation

## 2018-11-25 DIAGNOSIS — D649 Anemia, unspecified: Secondary | ICD-10-CM | POA: Insufficient documentation

## 2018-11-25 DIAGNOSIS — F429 Obsessive-compulsive disorder, unspecified: Secondary | ICD-10-CM | POA: Diagnosis not present

## 2018-11-25 DIAGNOSIS — M1990 Unspecified osteoarthritis, unspecified site: Secondary | ICD-10-CM | POA: Insufficient documentation

## 2018-11-25 DIAGNOSIS — Z79899 Other long term (current) drug therapy: Secondary | ICD-10-CM | POA: Diagnosis not present

## 2018-11-25 DIAGNOSIS — Z833 Family history of diabetes mellitus: Secondary | ICD-10-CM | POA: Insufficient documentation

## 2018-11-25 DIAGNOSIS — Z96651 Presence of right artificial knee joint: Secondary | ICD-10-CM | POA: Insufficient documentation

## 2018-11-25 DIAGNOSIS — E860 Dehydration: Secondary | ICD-10-CM | POA: Insufficient documentation

## 2018-11-25 DIAGNOSIS — Z882 Allergy status to sulfonamides status: Secondary | ICD-10-CM | POA: Insufficient documentation

## 2018-11-25 DIAGNOSIS — Z823 Family history of stroke: Secondary | ICD-10-CM | POA: Diagnosis not present

## 2018-11-25 DIAGNOSIS — B9620 Unspecified Escherichia coli [E. coli] as the cause of diseases classified elsewhere: Secondary | ICD-10-CM | POA: Insufficient documentation

## 2018-11-25 DIAGNOSIS — Z8042 Family history of malignant neoplasm of prostate: Secondary | ICD-10-CM | POA: Insufficient documentation

## 2018-11-25 DIAGNOSIS — K5790 Diverticulosis of intestine, part unspecified, without perforation or abscess without bleeding: Secondary | ICD-10-CM | POA: Diagnosis not present

## 2018-11-25 DIAGNOSIS — R0602 Shortness of breath: Secondary | ICD-10-CM | POA: Diagnosis not present

## 2018-11-25 DIAGNOSIS — Z8249 Family history of ischemic heart disease and other diseases of the circulatory system: Secondary | ICD-10-CM | POA: Insufficient documentation

## 2018-11-25 DIAGNOSIS — R109 Unspecified abdominal pain: Secondary | ICD-10-CM | POA: Diagnosis not present

## 2018-11-25 DIAGNOSIS — I10 Essential (primary) hypertension: Secondary | ICD-10-CM | POA: Diagnosis not present

## 2018-11-25 DIAGNOSIS — Z90710 Acquired absence of both cervix and uterus: Secondary | ICD-10-CM | POA: Diagnosis not present

## 2018-11-25 DIAGNOSIS — M810 Age-related osteoporosis without current pathological fracture: Secondary | ICD-10-CM | POA: Diagnosis not present

## 2018-11-25 HISTORY — DX: Depression, unspecified: F32.A

## 2018-11-25 HISTORY — DX: Pneumonia, unspecified organism: J18.9

## 2018-11-25 HISTORY — DX: Dorsalgia, unspecified: M54.9

## 2018-11-25 HISTORY — DX: Migraine, unspecified, not intractable, without status migrainosus: G43.909

## 2018-11-25 HISTORY — DX: Other chronic pain: G89.29

## 2018-11-25 HISTORY — DX: Major depressive disorder, single episode, unspecified: F32.9

## 2018-11-25 HISTORY — DX: Tubulo-interstitial nephritis, not specified as acute or chronic: N12

## 2018-11-25 HISTORY — DX: Headache, unspecified: R51.9

## 2018-11-25 HISTORY — DX: Headache: R51

## 2018-11-25 HISTORY — DX: Unspecified osteoarthritis, unspecified site: M19.90

## 2018-11-25 LAB — CBC WITH DIFFERENTIAL/PLATELET
Abs Immature Granulocytes: 0.08 10*3/uL — ABNORMAL HIGH (ref 0.00–0.07)
Basophils Absolute: 0.1 10*3/uL (ref 0.0–0.1)
Basophils Relative: 1 %
Eosinophils Absolute: 0.2 10*3/uL (ref 0.0–0.5)
Eosinophils Relative: 4 %
HCT: 37.2 % (ref 36.0–46.0)
Hemoglobin: 11.9 g/dL — ABNORMAL LOW (ref 12.0–15.0)
Immature Granulocytes: 1 %
Lymphocytes Relative: 21 %
Lymphs Abs: 1.4 10*3/uL (ref 0.7–4.0)
MCH: 28.1 pg (ref 26.0–34.0)
MCHC: 32 g/dL (ref 30.0–36.0)
MCV: 87.9 fL (ref 80.0–100.0)
Monocytes Absolute: 0.5 10*3/uL (ref 0.1–1.0)
Monocytes Relative: 7 %
Neutro Abs: 4.3 10*3/uL (ref 1.7–7.7)
Neutrophils Relative %: 66 %
Platelets: 344 10*3/uL (ref 150–400)
RBC: 4.23 MIL/uL (ref 3.87–5.11)
RDW: 13.3 % (ref 11.5–15.5)
WBC: 6.5 10*3/uL (ref 4.0–10.5)
nRBC: 0 % (ref 0.0–0.2)

## 2018-11-25 LAB — COMPREHENSIVE METABOLIC PANEL
ALT: 49 U/L — ABNORMAL HIGH (ref 0–44)
AST: 38 U/L (ref 15–41)
Albumin: 3.1 g/dL — ABNORMAL LOW (ref 3.5–5.0)
Alkaline Phosphatase: 65 U/L (ref 38–126)
Anion gap: 13 (ref 5–15)
BUN: 10 mg/dL (ref 8–23)
CO2: 22 mmol/L (ref 22–32)
Calcium: 8.9 mg/dL (ref 8.9–10.3)
Chloride: 106 mmol/L (ref 98–111)
Creatinine, Ser: 1.07 mg/dL — ABNORMAL HIGH (ref 0.44–1.00)
GFR calc Af Amer: >60 mL/min (ref >60)
GFR calc non Af Amer: 52 mL/min — ABNORMAL LOW (ref >60)
Glucose, Bld: 93 mg/dL (ref 70–99)
Potassium: 3.5 mmol/L (ref 3.5–5.1)
Sodium: 141 mmol/L (ref 135–145)
Total Bilirubin: 0.3 mg/dL (ref 0.3–1.2)
Total Protein: 6.6 g/dL (ref 6.5–8.1)

## 2018-11-25 LAB — URINALYSIS, ROUTINE W REFLEX MICROSCOPIC
Bilirubin Urine: NEGATIVE
Glucose, UA: NEGATIVE mg/dL
Ketones, ur: NEGATIVE mg/dL
Nitrite: NEGATIVE
Protein, ur: NEGATIVE mg/dL
Specific Gravity, Urine: 1.006 (ref 1.005–1.030)
pH: 6 (ref 5.0–8.0)

## 2018-11-25 LAB — I-STAT CG4 LACTIC ACID, ED
Lactic Acid, Venous: 1.05 mmol/L (ref 0.5–1.9)
Lactic Acid, Venous: 0.67 mmol/L (ref 0.5–1.9)

## 2018-11-25 MED ORDER — SODIUM CHLORIDE 0.9 % IV SOLN
2.0000 g | INTRAVENOUS | Status: DC
  Filled 2018-11-25: qty 20

## 2018-11-25 MED ORDER — HYDROCODONE-ACETAMINOPHEN 5-325 MG PO TABS
1.0000 | ORAL_TABLET | ORAL | Status: DC | PRN
  Administered 2018-11-25: 1 via ORAL
  Filled 2018-11-25: qty 1

## 2018-11-25 MED ORDER — ONDANSETRON HCL 4 MG/2ML IJ SOLN: 4.0000 mg | Freq: Four times a day (QID) | INTRAMUSCULAR | Status: DC | PRN

## 2018-11-25 MED ORDER — VENLAFAXINE HCL ER 150 MG PO CP24
150.0000 mg | ORAL_CAPSULE | Freq: Every day | ORAL | Status: DC
  Administered 2018-11-26: 150 mg via ORAL
  Filled 2018-11-25: qty 1

## 2018-11-25 MED ORDER — SODIUM CHLORIDE 0.9 % IV SOLN
1.0000 g | Freq: Once | INTRAVENOUS | Status: AC
  Administered 2018-11-25: 1 g via INTRAVENOUS
  Filled 2018-11-25: qty 10

## 2018-11-25 MED ORDER — SODIUM CHLORIDE 0.9 % IV BOLUS
500.0000 mL | Freq: Once | INTRAVENOUS | Status: AC
  Administered 2018-11-25: 500 mL via INTRAVENOUS

## 2018-11-25 MED ORDER — ENOXAPARIN SODIUM 40 MG/0.4ML ~~LOC~~ SOLN
40.0000 mg | SUBCUTANEOUS | Status: DC
  Administered 2018-11-25: 40 mg via SUBCUTANEOUS
  Filled 2018-11-25: qty 0.4

## 2018-11-25 MED ORDER — ACETAMINOPHEN 650 MG RE SUPP: 650.0000 mg | Freq: Four times a day (QID) | RECTAL | Status: DC | PRN

## 2018-11-25 MED ORDER — ACETAMINOPHEN 325 MG PO TABS: 650.0000 mg | ORAL_TABLET | Freq: Four times a day (QID) | ORAL | Status: DC | PRN

## 2018-11-25 MED ORDER — ONDANSETRON HCL 4 MG PO TABS: 4.0000 mg | ORAL_TABLET | Freq: Four times a day (QID) | ORAL | Status: DC | PRN

## 2018-11-25 MED ORDER — LEVOTHYROXINE SODIUM 112 MCG PO TABS
112.0000 ug | ORAL_TABLET | Freq: Every day | ORAL | Status: DC
  Administered 2018-11-26: 112 ug via ORAL
  Filled 2018-11-25: qty 1

## 2018-11-25 MED ORDER — SODIUM CHLORIDE 0.9 % IV SOLN
INTRAVENOUS | Status: DC
  Administered 2018-11-25 – 2018-11-26 (×2): via INTRAVENOUS

## 2018-11-25 NOTE — H&P (Signed)
History and Physical    [name] OVZ:[id number] DOB: Jul 05, 1946 DOA: 11/25/2018  PCP: Harlan Stains, MD  Patient coming from: Home.   I have personally briefly reviewed patient's old medical records in [place]  Chief Complaint: back pain, nausea, vomiting, chills since Saturday.   HPI: [name] is a 72 y.o. female with medical history significant of hypothyroidism, Diverticulosis, osteoarthritis, hypertension, reports having chills, associated with left sided back pain, nausea, vomiting, and lower abdominal pain. She presented to ED and got a dose of rocephin and was discharged on oral keflex. Pt reports that her symptoms were persistent, and was asked to come to ED for positive blood cultures from last ED visit. Today she reports she has persistent left sided back pain, some chills, profound nausea, some vomiting, and lower abdominal pain, headache, and some dizziness. She denies diarrhea,, hematemesis or hematochezia.    ED Course: on arrival to ED, her vitals were wnl. Labs reveal elevated creatinine of 1.07. Hemoglobin of 11.9. ua shows trace leukocytes.   Review of Systems: As per HPI otherwise 10 point review of systems negative.    Past Medical History:  Diagnosis Date  . Diverticulosis   . Hypertension   . Hypothyroidism   . OCD (obsessive compulsive disorder)   . Osteoarthritis   . Osteoporosis     Past Surgical History:  Procedure Laterality Date  . ABDOMINAL HYSTERECTOMY     partial then complete   . BUNIONECTOMY     bunion surgery and hammer surgery x 2   . HAMMER TOE SURGERY     left foot-2012  . KNEE ARTHROSCOPY     right   . PARTIAL KNEE ARTHROPLASTY  06/07/2012   Procedure: UNICOMPARTMENTAL KNEE;  Surgeon: Mauri Pole, MD;  Location: [facility];  Service: Orthopedics;  Laterality: Right;     reports that she has never smoked. She has never used smokeless tobacco. She reports that she drinks alcohol. She reports that she does not use  drugs.  Allergies  Allergen Reactions  . Prednisone     Makes very "hyper" and leaves black and blue marks  04/17/18 - STATES NO LONGER ALLERGIC  . Sulfonamide Derivatives     REACTION: huge black and blue marks    Family History  Problem Relation Age of Onset  . Stroke Mother   . Diabetes Mother   . Prostate cancer Father   . Heart disease Father   . Diabetes Father   . Diabetes Maternal Aunt   . Colon cancer Neg Hx   . Stomach cancer Neg Hx    Family history reviewed and not pertinent   Prior to Admission medications   Medication Sig Start Date End Date Taking? Authorizing Provider  Calcium Carbonate-Vitamin D (CALCIUM + D) 600-200 MG-UNIT TABS Take 1 tablet by mouth daily.   Yes [provider]  cholecalciferol (VITAMIN D) 1000 UNITS tablet Take 2,000 Units by mouth daily. Takes 2 tablets daily   Yes [provider]  diphenhydramine-acetaminophen (TYLENOL PM) 25-500 MG TABS tablet Take 1-2 tablets by mouth at bedtime as needed.    Yes [provider]  levothyroxine (SYNTHROID, LEVOTHROID) 112 MCG tablet Take by mouth daily before breakfast.    Yes [provider]  triamterene-hydrochlorothiazide (MAXZIDE-25) 37.5-25 MG tablet Take 1 tablet by mouth daily. 11/02/18  Yes [provider]  venlafaxine XR (EFFEXOR-XR) 150 MG 24 hr capsule Take 150 mg by mouth daily.    Yes [provider]  vitamin C (ASCORBIC ACID) 500 MG tablet Take 500 mg by mouth daily.   Yes [provider]  cephALEXin (KEFLEX) 500 MG capsule Take 1 capsule (500 mg total) by mouth 2 (two) times daily. Patient not taking: Reported on 11/25/2018 11/22/18   Quintella Reichert, MD  ciprofloxacin (CIPRO) 500 MG tablet Take 1 tablet (500 mg total) by mouth 2 (two) times daily. Patient not taking: Reported on 11/25/2018 09/09/17   Tanna Furry, MD  cyclobenzaprine (FLEXERIL) 5 MG tablet Take 1 tablet (5 mg total) by mouth 3 (three) times daily as needed for muscle  spasms. Patient not taking: Reported on 11/25/2018 04/17/18   Drenda Freeze, MD  diclofenac sodium (VOLTAREN) 1 % GEL Apply 2 g topically 4 (four) times daily. Patient not taking: Reported on 11/25/2018 04/17/18   Drenda Freeze, MD  diphenhydrAMINE (BENADRYL) 25 mg capsule Take 1 capsule (25 mg total) by mouth every 6 (six) hours as needed for itching, allergies or sleep. 06/08/12 06/18/12  Danae Orleans, PA-C  HYDROcodone-acetaminophen (NORCO/VICODIN) 5-325 MG tablet Take 1 tablet by mouth every 4 (four) hours as needed. Patient not taking: Reported on 11/25/2018 04/17/18   Drenda Freeze, MD  metroNIDAZOLE (FLAGYL) 500 MG tablet Take 1 tablet (500 mg total) by mouth 2 (two) times daily. Patient not taking: Reported on 11/25/2018 09/09/17   Tanna Furry, MD  ondansetron (ZOFRAN ODT) 4 MG disintegrating tablet Take 1 tablet (4 mg total) by mouth every 8 (eight) hours as needed for nausea or vomiting. Patient not taking: Reported on 11/25/2018 11/22/18   Quintella Reichert, MD  potassium chloride 20 MEQ TBCR Take 10 mEq by mouth daily. Patient not taking: Reported on 11/25/2018 11/22/18   Quintella Reichert, MD  traMADol (ULTRAM) 50 MG tablet Take 1 tablet (50 mg total) by mouth every 6 (six) hours as needed. Patient not taking: Reported on 11/25/2018 05/21/17   Willia Craze, NP    Physical Exam: Vitals:   11/25/18 1212 11/25/18 1308 11/25/18 1418 11/25/18 1515  BP: (!) 142/74 139/76 125/61 131/80  Pulse: 73 72 75 76  Resp: 16 18 18    Temp: 97.8 F (36.6 C)     TempSrc: Oral     SpO2: 100% 100% 100% 97%    Constitutional: NAD, calm, comfortable Vitals:   11/25/18 1212 11/25/18 1308 11/25/18 1418 11/25/18 1515  BP: (!) 142/74 139/76 125/61 131/80  Pulse: 73 72 75 76  Resp: 16 18 18    Temp: 97.8 F (36.6 C)     TempSrc: Oral     SpO2: 100% 100% 100% 97%   Eyes: PERRL, lids and conjunctivae normal ENMT: Mucous membranes are  Dry.   Neck: normal, supple, no masses, no  thyromegaly Respiratory: clear to auscultation bilaterally, no wheezing, no crackles. Normal respiratory effort. No accessory muscle use.  Cardiovascular: Regular rate and rhythm, no murmurs / rubs / gallops. No extremity edema. 2+ pedal pulses. No carotid bruits.  Abdomen: soft mild tenderness in the lower quadrant. No distention. Bowel sounds good.   Musculoskeletal: no clubbing / cyanosis. No joint deformity upper and lower extremities. Good ROM, no contractures. Normal muscle tone.  Skin: no rashes, lesions, ulcers. No induration Neurologic: CN 2-12 grossly intact. Sensation intact, DTR normal. Strength 5/5 in all 4.  Psychiatric: Normal judgment and insight. Alert and oriented x 3. Normal mood.     Labs on Admission: I have personally reviewed following labs and imaging studies  CBC: Recent Labs  Lab 11/22/18 1542 11/25/18  1220  WBC 9.0 6.5  NEUTROABS 7.6 4.3  HGB 11.7* 11.9*  HCT 35.1* 37.2  MCV 86.7 87.9  PLT 221 161   Basic Metabolic Panel: Recent Labs  Lab 11/22/18 1542 11/25/18 1220  NA 132* 141  K 2.8* 3.5  CL 97* 106  CO2 23 22  GLUCOSE 160* 93  BUN 14 10  CREATININE 1.07* 1.07*  CALCIUM 8.4* 8.9   GFR: Estimated Creatinine Clearance: 41.6 mL/min (A) (by C-G formula based on SCr of 1.07 mg/dL (H)). Liver Function Tests: Recent Labs  Lab 11/22/18 1542 11/25/18 1220  AST 28 38  ALT 21 49*  ALKPHOS 65 65  BILITOT 0.7 0.3  PROT 6.6 6.6  ALBUMIN 3.2* 3.1*   No results for input(s): LIPASE, AMYLASE in the last 168 hours. No results for input(s): AMMONIA in the last 168 hours. Coagulation Profile: No results for input(s): INR, PROTIME in the last 168 hours. Cardiac Enzymes: No results for input(s): CKTOTAL, CKMB, CKMBINDEX, TROPONINI in the last 168 hours. BNP (last 3 results) No results for input(s): PROBNP in the last 8760 hours. HbA1C: No results for input(s): HGBA1C in the last 72 hours. CBG: No results for input(s): GLUCAP in the last 168  hours. Lipid Profile: No results for input(s): CHOL, HDL, LDLCALC, TRIG, CHOLHDL, LDLDIRECT in the last 72 hours. Thyroid Function Tests: No results for input(s): TSH, T4TOTAL, FREET4, T3FREE, THYROIDAB in the last 72 hours. Anemia Panel: No results for input(s): VITAMINB12, FOLATE, FERRITIN, TIBC, IRON, RETICCTPCT in the last 72 hours. Urine analysis:    Component Value Date/Time   COLORURINE STRAW (A) 11/25/2018 1255   APPEARANCEUR CLEAR 11/25/2018 1255   LABSPEC 1.006 11/25/2018 1255   PHURINE 6.0 11/25/2018 1255   GLUCOSEU NEGATIVE 11/25/2018 1255   HGBUR SMALL (A) 11/25/2018 1255   BILIRUBINUR NEGATIVE 11/25/2018 1255   KETONESUR NEGATIVE 11/25/2018 1255   PROTEINUR NEGATIVE 11/25/2018 1255   UROBILINOGEN 0.2 06/01/2012 1418   NITRITE NEGATIVE 11/25/2018 1255   LEUKOCYTESUR TRACE (A) 11/25/2018 1255    Radiological Exams on Admission: No results found.  EKG: not done.   Assessment/Plan Active Problems:   Pyelonephritis    Acute pyelonephritis and E coli bacteremia:  Admit for IV rocephin for persistent symptoms .  Gentle hydration with NS.  Repeat US of the kidneys for further evaluation.  Follow repeat blood cultures.    Hypertension:  BP parameters well wnl.    Hypothyroidism: Resume synthroid.    Dehydration; Will need IV NS for 24 hours.  Recheck creatinine in am.    Mild normocytic anemia; Follow hemoglobin.   DVT prophylaxis: lovenox.  Code Status: full code.  Family Communication: husband at bedside.  Disposition Plan: possible d.c home tomorrow ifher symptoms improve.  Consults called: none.  Admission status: obs / medsurg.    Hosie Poisson MD [facility] Pager [phone]  If 7PM-7AM, please contact night-coverage [email or web address] Password [facility]  11/25/2018, 3:57 PM

## 2018-11-25 NOTE — ED Triage Notes (Signed)
Pt presents with 6 day h/o L flank pain, chills and decreased appetite.  Pt went to [facility], was told she had a kidney infection, was given abx; has been on abx x 4 days, reports she was informed today via phone that she had a blood infection from cultures taken there.

## 2018-11-25 NOTE — Telephone Encounter (Signed)
LATE NOTE: On 11/24/18 at approximately 0400 received call from [place] in micro lab with positive blood culture results. At that time Dr. Florina Ou was consulted. He reviewed chart stating pt had received rocephin in ED and was on home antibiotic therapy. Dr. Florina Ou recommendation was no further action was needed and pt was being treated appropriately.

## 2018-11-25 NOTE — ED Provider Notes (Signed)
[facility] DEPARTMENT Provider Note   CSN: [id number] Arrival date & time: 11/25/18  1200     History   Chief Complaint No chief complaint on file.   HPI CHELISE Duncan is a 72 y.o. female with history of hypertension, hypothyroidism, osteoporosis who presents after being diagnosed with pyelonephritis and had positive blood cultures.  Patient was seen 2 days ago at [facility] and diagnosed with polynephritis and sent home with Keflex.  Her blood cultures resulted positive E. coli.  She was called today and told to come immediately to the ED.  Patient continues to feel weak and tired and has had intermittent left-sided mid back pain.  She is having associated chills, but no longer having any fevers.  She has had some nausea, but she is according this to taking the potassium.  She has not had any more vomiting.  She denies any chest pain, but has had some intermittent shortness of breath with exertion.  HPI  Past Medical History:  Diagnosis Date  . Diverticulosis   . Hypertension   . Hypothyroidism   . OCD (obsessive compulsive disorder)   . Osteoarthritis   . Osteoporosis     Patient Active Problem List   Diagnosis Date Noted  . Pyelonephritis 11/25/2018  . S/P right UKR 06/07/2012    Past Surgical History:  Procedure Laterality Date  . ABDOMINAL HYSTERECTOMY     partial then complete   . BUNIONECTOMY     bunion surgery and hammer surgery x 2   . HAMMER TOE SURGERY     left foot-2012  . KNEE ARTHROSCOPY     right   . PARTIAL KNEE ARTHROPLASTY  06/07/2012   Procedure: UNICOMPARTMENTAL KNEE;  Surgeon: Mauri Pole, MD;  Location: [facility];  Service: Orthopedics;  Laterality: Right;     OB History   None      Home Medications    Prior to Admission medications   Medication Sig Start Date End Date Taking? Authorizing Provider  Calcium Carbonate-Vitamin D (CALCIUM + D) 600-200 MG-UNIT TABS Take 1 tablet by mouth daily.   Yes  [provider]  cholecalciferol (VITAMIN D) 1000 UNITS tablet Take 2,000 Units by mouth daily. Takes 2 tablets daily   Yes [provider]  diphenhydramine-acetaminophen (TYLENOL PM) 25-500 MG TABS tablet Take 1-2 tablets by mouth at bedtime as needed.    Yes [provider]  levothyroxine (SYNTHROID, LEVOTHROID) 112 MCG tablet Take by mouth daily before breakfast.    Yes [provider]  triamterene-hydrochlorothiazide (MAXZIDE-25) 37.5-25 MG tablet Take 1 tablet by mouth daily. 11/02/18  Yes [provider]  venlafaxine XR (EFFEXOR-XR) 150 MG 24 hr capsule Take 150 mg by mouth daily.    Yes [provider]  vitamin C (ASCORBIC ACID) 500 MG tablet Take 500 mg by mouth daily.   Yes [provider]  cephALEXin (KEFLEX) 500 MG capsule Take 1 capsule (500 mg total) by mouth 2 (two) times daily. Patient not taking: Reported on 11/25/2018 11/22/18   Quintella Reichert, MD  ciprofloxacin (CIPRO) 500 MG tablet Take 1 tablet (500 mg total) by mouth 2 (two) times daily. Patient not taking: Reported on 11/25/2018 09/09/17   Tanna Furry, MD  cyclobenzaprine (FLEXERIL) 5 MG tablet Take 1 tablet (5 mg total) by mouth 3 (three) times daily as needed for muscle spasms. Patient not taking: Reported on 11/25/2018 04/17/18   Drenda Freeze, MD  diclofenac sodium (VOLTAREN) 1 %  GEL Apply 2 g topically 4 (four) times daily. Patient not taking: Reported on 11/25/2018 04/17/18   Drenda Freeze, MD  diphenhydrAMINE (BENADRYL) 25 mg capsule Take 1 capsule (25 mg total) by mouth every 6 (six) hours as needed for itching, allergies or sleep. 06/08/12 06/18/12  Danae [place], PA-C  HYDROcodone-acetaminophen (NORCO/VICODIN) 5-325 MG tablet Take 1 tablet by mouth every 4 (four) hours as needed. Patient not taking: Reported on 11/25/2018 04/17/18   Drenda Freeze, MD  metroNIDAZOLE (FLAGYL) 500 MG tablet Take 1 tablet (500 mg total) by mouth 2 (two) times  daily. Patient not taking: Reported on 11/25/2018 09/09/17   Tanna Furry, MD  ondansetron (ZOFRAN ODT) 4 MG disintegrating tablet Take 1 tablet (4 mg total) by mouth every 8 (eight) hours as needed for nausea or vomiting. Patient not taking: Reported on 11/25/2018 11/22/18   Quintella Reichert, MD  potassium chloride 20 MEQ TBCR Take 10 mEq by mouth daily. Patient not taking: Reported on 11/25/2018 11/22/18   Quintella Reichert, MD  traMADol (ULTRAM) 50 MG tablet Take 1 tablet (50 mg total) by mouth every 6 (six) hours as needed. Patient not taking: Reported on 11/25/2018 05/21/17   Willia Craze, NP    Family History Family History  Problem Relation Age of Onset  . Stroke Mother   . Diabetes Mother   . Prostate cancer Father   . Heart disease Father   . Diabetes Father   . Diabetes Maternal Aunt   . Colon cancer Neg Hx   . Stomach cancer Neg Hx     Social History Social History   Tobacco Use  . Smoking status: Never Smoker  . Smokeless tobacco: Never Used  Substance Use Topics  . Alcohol use: Yes    Comment: rare  . Drug use: No     Allergies   Prednisone and Sulfonamide derivatives   Review of Systems Review of Systems  Constitutional: Positive for chills and fatigue. Negative for fever.  HENT: Negative for facial swelling and sore throat.   Respiratory: Positive for shortness of breath.   Cardiovascular: Negative for chest pain.  Gastrointestinal: Positive for nausea. Negative for abdominal pain and vomiting.  Genitourinary: Positive for flank pain. Negative for dysuria.  Musculoskeletal: Positive for back pain.  Skin: Negative for rash and wound.  Neurological: Positive for weakness. Negative for headaches.  Psychiatric/Behavioral: The patient is not nervous/anxious.      Physical Exam Updated Vital Signs BP 131/80   Pulse 76   Temp 97.8 F (36.6 C) (Oral)   Resp 18   SpO2 97%   Physical Exam  Constitutional: She appears well-developed and well-nourished. No  distress.  HENT:  Head: Normocephalic and atraumatic.  Mouth/Throat: Oropharynx is clear and moist. No oropharyngeal exudate.  Eyes: Pupils are equal, round, and reactive to light. Conjunctivae are normal. Right eye exhibits no discharge. Left eye exhibits no discharge. No scleral icterus.  Neck: Normal range of motion. Neck supple. No thyromegaly present.  Cardiovascular: Normal rate, regular rhythm, normal heart sounds and intact distal pulses. Exam reveals no gallop and no friction rub.  No murmur heard. Pulmonary/Chest: Effort normal and breath sounds normal. No stridor. No respiratory distress. She has no wheezes. She has no rales.  Abdominal: Soft. Bowel sounds are normal. She exhibits no distension. There is tenderness in the left lower quadrant. There is no rebound, no guarding and no CVA tenderness.  Musculoskeletal: She exhibits no edema.  Lymphadenopathy:    She has no  cervical adenopathy.  Neurological: She is alert. Coordination normal.  Skin: Skin is warm and dry. No rash noted. She is not diaphoretic. No pallor.  Psychiatric: She has a normal mood and affect.  Nursing note and vitals reviewed.    ED Treatments / Results  Labs (all labs ordered are listed, but only abnormal results are displayed) Labs Reviewed  COMPREHENSIVE METABOLIC PANEL - Abnormal; Notable for the following components:      Result Value   Creatinine, Ser 1.07 (*)    Albumin 3.1 (*)    ALT 49 (*)    GFR calc non Af Amer 52 (*)    All other components within normal limits  CBC WITH DIFFERENTIAL/PLATELET - Abnormal; Notable for the following components:   Hemoglobin 11.9 (*)    Abs Immature Granulocytes 0.08 (*)    All other components within normal limits  URINALYSIS, ROUTINE W REFLEX MICROSCOPIC - Abnormal; Notable for the following components:   Color, Urine STRAW (*)    Hgb urine dipstick SMALL (*)    Leukocytes, UA TRACE (*)    Bacteria, UA RARE (*)    All other components within normal  limits  CULTURE, BLOOD (ROUTINE X 2)  CULTURE, BLOOD (ROUTINE X 2)  URINE CULTURE  I-STAT CG4 LACTIC ACID, ED  I-STAT CG4 LACTIC ACID, ED    EKG None  Radiology No results found.  Procedures Procedures (including critical care time)  Medications Ordered in ED Medications  0.9 %  sodium chloride infusion (has no administration in time range)  cefTRIAXone (ROCEPHIN) 1 g in sodium chloride 0.9 % 100 mL IVPB (0 g Intravenous Stopped 11/25/18 1414)  sodium chloride 0.9 % bolus 500 mL (0 mLs Intravenous Stopped 11/25/18 1618)     Initial Impression / Assessment and Plan / ED Course  I have reviewed the triage vital signs and the nursing notes.  Pertinent labs & imaging results that were available during my care of the patient were reviewed by me and considered in my medical decision making (see chart for details).     Patient presenting after visit for pyelonephritis with positive blood cultures.  Blood cultures grew out E. coli.  Patient continuing to feel poorly, although no fever in the past couple days.  She is still having chills and left lower quadrant pain.  Labs are stable.  Urine shows small hematuria, trace leukocytes, 6-10 WBC.  Lactic is negative today.  Repeat blood cultures and urine culture sent and pending.  IV Rocephin initiated in the ED.  I spoke with Dr. Karleen Hampshire with [facility] who accepts patient for admission.  I appreciate her assistance with the patient. I discussed patient case with Dr. Alvino Chapel who guided the patient's management and agrees with plan.   Final Clinical Impressions(s) / ED Diagnoses   Final diagnoses:  Pyelonephritis  Bacteremia    ED Discharge Orders    None       Frederica Kuster, PA-C 11/25/18 1634    Davonna Belling, MD 11/26/18 [phone]

## 2018-11-26 DIAGNOSIS — N12 Tubulo-interstitial nephritis, not specified as acute or chronic: Secondary | ICD-10-CM | POA: Diagnosis not present

## 2018-11-26 DIAGNOSIS — R7881 Bacteremia: Secondary | ICD-10-CM | POA: Diagnosis not present

## 2018-11-26 LAB — COMPREHENSIVE METABOLIC PANEL WITH GFR
ALT: 35 U/L (ref 0–44)
AST: 25 U/L (ref 15–41)
Albumin: 2.4 g/dL — ABNORMAL LOW (ref 3.5–5.0)
Alkaline Phosphatase: 53 U/L (ref 38–126)
Anion gap: 10 (ref 5–15)
BUN: 9 mg/dL (ref 8–23)
CO2: 24 mmol/L (ref 22–32)
Calcium: 8.3 mg/dL — ABNORMAL LOW (ref 8.9–10.3)
Chloride: 105 mmol/L (ref 98–111)
Creatinine, Ser: 1.13 mg/dL — ABNORMAL HIGH (ref 0.44–1.00)
GFR calc Af Amer: 56 mL/min — ABNORMAL LOW
GFR calc non Af Amer: 49 mL/min — ABNORMAL LOW
Glucose, Bld: 118 mg/dL — ABNORMAL HIGH (ref 70–99)
Potassium: 3.5 mmol/L (ref 3.5–5.1)
Sodium: 139 mmol/L (ref 135–145)
Total Bilirubin: 0.2 mg/dL — ABNORMAL LOW (ref 0.3–1.2)
Total Protein: 5.5 g/dL — ABNORMAL LOW (ref 6.5–8.1)

## 2018-11-26 LAB — CBC
HCT: 33.4 % — ABNORMAL LOW (ref 36.0–46.0)
Hemoglobin: 10.6 g/dL — ABNORMAL LOW (ref 12.0–15.0)
MCH: 27.5 pg (ref 26.0–34.0)
MCHC: 31.7 g/dL (ref 30.0–36.0)
MCV: 86.5 fL (ref 80.0–100.0)
Platelets: 366 10*3/uL (ref 150–400)
RBC: 3.86 MIL/uL — ABNORMAL LOW (ref 3.87–5.11)
RDW: 13.4 % (ref 11.5–15.5)
WBC: 7.8 10*3/uL (ref 4.0–10.5)
nRBC: 0 % (ref 0.0–0.2)

## 2018-11-26 LAB — CULTURE, BLOOD (ROUTINE X 2): Special Requests: ADEQUATE

## 2018-11-26 LAB — URINE CULTURE: Culture: NO GROWTH

## 2018-11-26 NOTE — Discharge Summary (Signed)
Physician Discharge Summary  [name] OYD:[id number] DOB: 1946/08/06 DOA: 11/25/2018  PCP: Harlan Stains, MD  Admit date: 11/25/2018 Discharge date: 11/26/2018  Admitted From: home Discharge disposition: home   Recommendations for Outpatient Follow-Up:   Monitor albumin level (? If cause of LE edema)-- low here but may be from infection Patient to finish abx course- to notify PCP if not improving  Discharge Diagnosis:   Active Problems:   Pyelonephritis   Bacteremia due to Gram-negative bacteria    Discharge Condition: Improved.  Diet recommendation: Low sodium, heart healthy.    Wound care: None.  Code status: Full.   History of Present Illness:   [name] is a 72 y.o. female with medical history significant of hypothyroidism, Diverticulosis, osteoarthritis, hypertension, reports having chills, associated with left sided back pain, nausea, vomiting, and lower abdominal pain. She presented to ED and got a dose of rocephin and was discharged on oral keflex. Pt reports that her symptoms were persistent, and was asked to come to ED for positive blood cultures from last ED visit. Today she reports she has persistent left sided back pain, some chills, profound nausea, some vomiting, and lower abdominal pain, headache, and some dizziness. She denies diarrhea,, hematemesis or hematochezia.     Hospital Course by Problem:   Acute pyelonephritis and E coli bacteremia:  Feeling much better -repeat cultures NGTD -finish course of prior 10day abx but per pharmacy recommendation, increase to TID   Hypertension:  BP parameters well wnl.   Hypothyroidism: Resume synthroid.      Medical Consultants:      Discharge Exam:   Vitals:   11/26/18 0538 11/26/18 1123  BP: (!) 143/71 136/78  Pulse: 78 68  Resp: 18 19  Temp: 97.7 F (36.5 C) 98.5 F (36.9 C)  SpO2: 99% 98%   Vitals:   11/25/18 1938 11/25/18 2326 11/26/18 0538 11/26/18 1123  BP:  130/66 130/69 (!) 143/71 136/78  Pulse: 83 74 78 68  Resp: 18 18 18 19   Temp: 97.9 F (36.6 C) 98.2 F (36.8 C) 97.7 F (36.5 C) 98.5 F (36.9 C)  TempSrc: Oral Oral Oral Oral  SpO2: 97% 94% 99% 98%  Weight: 68 kg     Height: 5\' 1"  (1.549 m)       General exam: Appears calm and comfortable.    The results of significant diagnostics from this hospitalization (including imaging, microbiology, ancillary and laboratory) are listed below for reference.     Procedures and Diagnostic Studies:   No results found.   Labs:   Basic Metabolic Panel: Recent Labs  Lab 11/22/18 1542 11/25/18 1220 11/26/18 0422  NA 132* 141 139  K 2.8* 3.5 3.5  CL 97* 106 105  CO2 23 22 24   GLUCOSE 160* 93 118*  BUN 14 10 9   CREATININE 1.07* 1.07* 1.13*  CALCIUM 8.4* 8.9 8.3*   GFR Estimated Creatinine Clearance: 39.7 mL/min (A) (by C-G formula based on SCr of 1.13 mg/dL (H)). Liver Function Tests: Recent Labs  Lab 11/22/18 1542 11/25/18 1220 11/26/18 0422  AST 28 38 25  ALT 21 49* 35  ALKPHOS 65 65 53  BILITOT 0.7 0.3 0.2*  PROT 6.6 6.6 5.5*  ALBUMIN 3.2* 3.1* 2.4*   No results for input(s): LIPASE, AMYLASE in the last 168 hours. No results for input(s): AMMONIA in the last 168 hours. Coagulation profile No results for input(s): INR, PROTIME in the last 168 hours.  CBC: Recent Labs  Lab 11/22/18 1542 11/25/18 1220 11/26/18 0422  WBC 9.0 6.5 7.8  NEUTROABS 7.6 4.3  --   HGB 11.7* 11.9* 10.6*  HCT 35.1* 37.2 33.4*  MCV 86.7 87.9 86.5  PLT 221 344 366   Cardiac Enzymes: No results for input(s): CKTOTAL, CKMB, CKMBINDEX, TROPONINI in the last 168 hours. BNP: Invalid input(s): POCBNP CBG: No results for input(s): GLUCAP in the last 168 hours. D-Dimer No results for input(s): DDIMER in the last 72 hours. Hgb A1c No results for input(s): HGBA1C in the last 72 hours. Lipid Profile No results for input(s): CHOL, HDL, LDLCALC, TRIG, CHOLHDL, LDLDIRECT in the last 72  hours. Thyroid function studies No results for input(s): TSH, T4TOTAL, T3FREE, THYROIDAB in the last 72 hours.  Invalid input(s): FREET3 Anemia work up No results for input(s): VITAMINB12, FOLATE, FERRITIN, TIBC, IRON, RETICCTPCT in the last 72 hours. Microbiology Recent Results (from the past 240 hour(s))  Blood Culture (routine x 2)     Status: None (Preliminary result)   Collection Time: 11/22/18  3:45 PM  Result Value Ref Range Status   Specimen Description   Final    BLOOD RIGHT ANTECUBITAL Performed at [facility], [address] [place], [place] [address]    Special Requests   Final    BOTTLES DRAWN AEROBIC AND ANAEROBIC Blood Culture adequate volume Performed at [facility], [address] [place], [place] [address]    Culture   Final    NO GROWTH 4 DAYS Performed at [facility], [place] [address], [place], [place] [address]    Report Status PENDING  Incomplete  Blood Culture (routine x 2)     Status: Abnormal   Collection Time: 11/22/18  3:55 PM  Result Value Ref Range Status   Specimen Description   Final    BLOOD BLOOD RIGHT WRIST Performed at [facility], [address] [place], [place] [address]    Special Requests   Final    BOTTLES DRAWN AEROBIC AND ANAEROBIC Blood Culture adequate volume Performed at [facility], [address] [place], [place] [address]    Culture  Setup Time   Final    GRAM NEGATIVE RODS AEROBIC BOTTLE ONLY CRITICAL RESULT CALLED TO, READ BACK BY AND VERIFIED WITH: Casandra Doffing RN 0400 11/24/18 A BROWNING Performed at [facility], [place] [address] [place], [place] [address]    Culture ESCHERICHIA COLI (A)  Final   Report Status 11/26/2018 FINAL  Final   Organism ID, Bacteria ESCHERICHIA COLI  Final      Susceptibility   Escherichia coli - MIC*    AMPICILLIN 8 SENSITIVE Sensitive     CEFAZOLIN <=4 SENSITIVE Sensitive     CEFEPIME <=1 SENSITIVE Sensitive     CEFTAZIDIME <=1  SENSITIVE Sensitive     CEFTRIAXONE <=1 SENSITIVE Sensitive     CIPROFLOXACIN <=0.25 SENSITIVE Sensitive     GENTAMICIN <=1 SENSITIVE Sensitive     IMIPENEM <=0.25 SENSITIVE Sensitive     TRIMETH/SULFA <=20 SENSITIVE Sensitive     AMPICILLIN/SULBACTAM 4 SENSITIVE Sensitive     PIP/TAZO <=4 SENSITIVE Sensitive     Extended ESBL NEGATIVE Sensitive     * ESCHERICHIA COLI  Blood Culture ID Panel (Reflexed)     Status: Abnormal   Collection Time: 11/22/18  3:55 PM  Result Value Ref Range Status   Enterococcus species NOT DETECTED NOT DETECTED Final   Listeria monocytogenes NOT  DETECTED NOT DETECTED Final   Staphylococcus species NOT DETECTED NOT DETECTED Final   Staphylococcus aureus (BCID) NOT DETECTED NOT DETECTED Final   Streptococcus species NOT DETECTED NOT DETECTED Final   Streptococcus agalactiae NOT DETECTED NOT DETECTED Final   Streptococcus pneumoniae NOT DETECTED NOT DETECTED Final   Streptococcus pyogenes NOT DETECTED NOT DETECTED Final   Acinetobacter baumannii NOT DETECTED NOT DETECTED Final   Enterobacteriaceae species DETECTED (A) NOT DETECTED Final    Comment: Enterobacteriaceae represent a large family of gram-negative bacteria, not a single organism. CRITICAL RESULT CALLED TO, READ BACK BY AND VERIFIED WITH: K NEAL RN 0400 11/24/18 A BROWNING    Enterobacter cloacae complex NOT DETECTED NOT DETECTED Final   Escherichia coli DETECTED (A) NOT DETECTED Final    Comment: CRITICAL RESULT CALLED TO, READ BACK BY AND VERIFIED WITH: K NEAL RN 0400 11/24/18 A BROWNING    Klebsiella oxytoca NOT DETECTED NOT DETECTED Final   Klebsiella pneumoniae NOT DETECTED NOT DETECTED Final   Proteus species NOT DETECTED NOT DETECTED Final   Serratia marcescens NOT DETECTED NOT DETECTED Final   Carbapenem resistance NOT DETECTED NOT DETECTED Final   Haemophilus influenzae NOT DETECTED NOT DETECTED Final   Neisseria meningitidis NOT DETECTED NOT DETECTED Final   Pseudomonas aeruginosa NOT  DETECTED NOT DETECTED Final   Candida albicans NOT DETECTED NOT DETECTED Final   Candida glabrata NOT DETECTED NOT DETECTED Final   Candida krusei NOT DETECTED NOT DETECTED Final   Candida parapsilosis NOT DETECTED NOT DETECTED Final   Candida tropicalis NOT DETECTED NOT DETECTED Final    Comment: Performed at [facility], [place] [address], [place], [place] [address]  Urine culture     Status: None   Collection Time: 11/25/18 12:55 PM  Result Value Ref Range Status   Specimen Description URINE, RANDOM  Final   Special Requests NONE  Final   Culture   Final    NO GROWTH Performed at [facility], [place] [address], [place], [place] [address]    Report Status 11/26/2018 FINAL  Final  Culture, blood (routine x 2)     Status: None (Preliminary result)   Collection Time: 11/25/18  1:45 PM  Result Value Ref Range Status   Specimen Description BLOOD RIGHT HAND  Final   Special Requests   Final    BOTTLES DRAWN AEROBIC AND ANAEROBIC Blood Culture adequate volume   Culture   Final    NO GROWTH 1 DAY Performed at [facility], [place] [address], [place], [place] [address]    Report Status PENDING  Incomplete  Culture, blood (routine x 2)     Status: None (Preliminary result)   Collection Time: 11/25/18  1:50 PM  Result Value Ref Range Status   Specimen Description BLOOD LEFT ANTECUBITAL  Final   Special Requests   Final    BOTTLES DRAWN AEROBIC AND ANAEROBIC Blood Culture results may not be optimal due to an excessive volume of blood received in culture bottles   Culture   Final    NO GROWTH 1 DAY Performed at [facility], [place] [address] [place], [place] [address]    Report Status PENDING  Incomplete     Discharge Instructions:   Discharge Instructions    Call MD for:  persistant dizziness or light-headedness   Complete by:  As directed    Call MD for:  severe uncontrolled pain   Complete by:  As directed    Call  MD for:  temperature >100.4   Complete by:  As  directed    Diet - low sodium heart healthy   Complete by:  As directed    Discharge instructions   Complete by:  As directed    Take medication as directed Follow up with PCP 1-2 weeks for evaluation of kidney function Recommend Albumin check as well   Increase activity slowly   Complete by:  As directed      Allergies as of 11/26/2018      Reactions   Prednisone    Makes very "hyper" and leaves black and blue marks  04/17/18 - STATES NO LONGER ALLERGIC   Sulfonamide Derivatives    REACTION: huge black and blue marks      Medication List    STOP taking these medications   ciprofloxacin 500 MG tablet Commonly known as:  CIPRO   cyclobenzaprine 5 MG tablet Commonly known as:  FLEXERIL   HYDROcodone-acetaminophen 5-325 MG tablet Commonly known as:  NORCO/VICODIN   metroNIDAZOLE 500 MG tablet Commonly known as:  FLAGYL   Potassium Chloride ER 20 MEQ Tbcr   traMADol 50 MG tablet Commonly known as:  ULTRAM     TAKE these medications   Calcium Carbonate-Vitamin D 600-200 MG-UNIT Tabs Take 1 tablet by mouth daily.   cephALEXin 500 MG capsule Commonly known as:  KEFLEX Take 1 capsule (500 mg total) by mouth 2 (two) times daily.   cholecalciferol 1000 units tablet Commonly known as:  VITAMIN D Take 2,000 Units by mouth daily. Takes 2 tablets daily   diclofenac sodium 1 % Gel Commonly known as:  VOLTAREN Apply 2 g topically 4 (four) times daily.   diphenhydrAMINE 25 mg capsule Commonly known as:  BENADRYL Take 1 capsule (25 mg total) by mouth every 6 (six) hours as needed for itching, allergies or sleep.   diphenhydramine-acetaminophen 25-500 MG Tabs tablet Commonly known as:  TYLENOL PM Take 1-2 tablets by mouth at bedtime as needed.   levothyroxine 112 MCG tablet Commonly known as:  SYNTHROID, LEVOTHROID Take by mouth daily before breakfast.   ondansetron 4 MG disintegrating tablet Commonly known as:  ZOFRAN-ODT Take 1 tablet (4 mg total) by mouth every  8 (eight) hours as needed for nausea or vomiting.   triamterene-hydrochlorothiazide 37.5-25 MG tablet Commonly known as:  MAXZIDE-25 Take 1 tablet by mouth daily.   venlafaxine XR 150 MG 24 hr capsule Commonly known as:  EFFEXOR-XR Take 150 mg by mouth daily.   vitamin C 500 MG tablet Commonly known as:  ASCORBIC ACID Take 500 mg by mouth daily.         Time coordinating discharge: 25 min  Signed:  Geradine Girt DO  [facility] 11/26/2018, 5:20 PM

## 2018-11-27 ENCOUNTER — Telehealth: Payer: Self-pay

## 2018-11-27 LAB — CULTURE, BLOOD (ROUTINE X 2)
Culture: NO GROWTH
Special Requests: ADEQUATE

## 2018-11-27 NOTE — Progress Notes (Signed)
ED Antimicrobial Stewardship Positive Culture Follow Up   [name] is an 72 y.o. female who presented to [facility] on 11/25/2018 with a chief complaint of left sided back pain, N/V and lower abdominal pain. She was treated with one dose of ceftriaxone and discharged on cephalexin 500 mg twice daily for pyelonephritis.   Recent Results (from the past 720 hour(s))  Blood Culture (routine x 2)     Status: None (Preliminary result)   Collection Time: 11/22/18  3:45 PM  Result Value Ref Range Status   Specimen Description   Final    BLOOD RIGHT ANTECUBITAL Performed at [facility], [address] [place], [place] [address]    Special Requests   Final    BOTTLES DRAWN AEROBIC AND ANAEROBIC Blood Culture adequate volume Performed at [facility], [address] [place], [place] [address]    Culture   Final    NO GROWTH 4 DAYS Performed at [facility], [place] [address] [place], [place] [address]    Report Status PENDING  Incomplete  Blood Culture (routine x 2)     Status: Abnormal   Collection Time: 11/22/18  3:55 PM  Result Value Ref Range Status   Specimen Description   Final    BLOOD BLOOD RIGHT WRIST Performed at [facility], [address] [place], [place] [address]    Special Requests   Final    BOTTLES DRAWN AEROBIC AND ANAEROBIC Blood Culture adequate volume Performed at [facility], [address] [place], [place] [address]    Culture  Setup Time   Final    GRAM NEGATIVE RODS AEROBIC BOTTLE ONLY CRITICAL RESULT CALLED TO, READ BACK BY AND VERIFIED WITH: Casandra Doffing RN 0400 11/24/18 A BROWNING Performed at [facility], [place] [address], [place], [place] [address]    Culture ESCHERICHIA COLI (A)  Final   Report Status 11/26/2018 FINAL  Final   Organism ID, Bacteria ESCHERICHIA COLI  Final      Susceptibility   Escherichia coli - MIC*    AMPICILLIN 8 SENSITIVE Sensitive     CEFAZOLIN <=4 SENSITIVE Sensitive    CEFEPIME <=1 SENSITIVE Sensitive     CEFTAZIDIME <=1 SENSITIVE Sensitive     CEFTRIAXONE <=1 SENSITIVE Sensitive     CIPROFLOXACIN <=0.25 SENSITIVE Sensitive     GENTAMICIN <=1 SENSITIVE Sensitive     IMIPENEM <=0.25 SENSITIVE Sensitive     TRIMETH/SULFA <=20 SENSITIVE Sensitive     AMPICILLIN/SULBACTAM 4 SENSITIVE Sensitive     PIP/TAZO <=4 SENSITIVE Sensitive     Extended ESBL NEGATIVE Sensitive     * ESCHERICHIA COLI  Blood Culture ID Panel (Reflexed)     Status: Abnormal   Collection Time: 11/22/18  3:55 PM  Result Value Ref Range Status   Enterococcus species NOT DETECTED NOT DETECTED Final   Listeria monocytogenes NOT DETECTED NOT DETECTED Final   Staphylococcus species NOT DETECTED NOT DETECTED Final   Staphylococcus aureus (BCID) NOT DETECTED NOT DETECTED Final   Streptococcus species NOT DETECTED NOT DETECTED Final   Streptococcus agalactiae NOT DETECTED NOT DETECTED Final   Streptococcus pneumoniae NOT DETECTED NOT DETECTED Final   Streptococcus pyogenes NOT DETECTED NOT DETECTED Final   Acinetobacter baumannii NOT DETECTED NOT DETECTED Final   Enterobacteriaceae species DETECTED (A) NOT DETECTED Final    Comment: Enterobacteriaceae represent a large family of gram-negative bacteria, not a single organism. CRITICAL RESULT  CALLED TO, READ BACK BY AND VERIFIED WITH: K NEAL RN 0400 11/24/18 A BROWNING    Enterobacter cloacae complex NOT DETECTED NOT DETECTED Final   Escherichia coli DETECTED (A) NOT DETECTED Final    Comment: CRITICAL RESULT CALLED TO, READ BACK BY AND VERIFIED WITH: K NEAL RN 0400 11/24/18 A BROWNING    Klebsiella oxytoca NOT DETECTED NOT DETECTED Final   Klebsiella pneumoniae NOT DETECTED NOT DETECTED Final   Proteus species NOT DETECTED NOT DETECTED Final   Serratia marcescens NOT DETECTED NOT DETECTED Final   Carbapenem resistance NOT DETECTED NOT DETECTED Final   Haemophilus influenzae NOT DETECTED NOT DETECTED Final   Neisseria meningitidis NOT  DETECTED NOT DETECTED Final   Pseudomonas aeruginosa NOT DETECTED NOT DETECTED Final   Candida albicans NOT DETECTED NOT DETECTED Final   Candida glabrata NOT DETECTED NOT DETECTED Final   Candida krusei NOT DETECTED NOT DETECTED Final   Candida parapsilosis NOT DETECTED NOT DETECTED Final   Candida tropicalis NOT DETECTED NOT DETECTED Final    Comment: Performed at [facility], [place] [address] [place], [place] [address]  Urine culture     Status: None   Collection Time: 11/25/18 12:55 PM  Result Value Ref Range Status   Specimen Description URINE, RANDOM  Final   Special Requests NONE  Final   Culture   Final    NO GROWTH Performed at [facility], [place] [address] [place], [place] [address]    Report Status 11/26/2018 FINAL  Final  Culture, blood (routine x 2)     Status: None (Preliminary result)   Collection Time: 11/25/18  1:45 PM  Result Value Ref Range Status   Specimen Description BLOOD RIGHT HAND  Final   Special Requests   Final    BOTTLES DRAWN AEROBIC AND ANAEROBIC Blood Culture adequate volume   Culture   Final    NO GROWTH 1 DAY Performed at [facility], [place] [address], [place], [place] [address]    Report Status PENDING  Incomplete  Culture, blood (routine x 2)     Status: None (Preliminary result)   Collection Time: 11/25/18  1:50 PM  Result Value Ref Range Status   Specimen Description BLOOD LEFT ANTECUBITAL  Final   Special Requests   Final    BOTTLES DRAWN AEROBIC AND ANAEROBIC Blood Culture results may not be optimal due to an excessive volume of blood received in culture bottles   Culture   Final    NO GROWTH 1 DAY Performed at [facility], [place] [address], [place], [place] [address]    Report Status PENDING  Incomplete    Given that patient is now growing E. Coli in her blood, patient placement to inquire if patient is feeling better. If she is feeling worse, she should return to the ED. If symptoms have improved, she is to  increase her cephalexin to 500 mg three times daily for a 10 day course to cover for bacteremia   ED Provider: Rodell Perna, PA-C    Albertina Parr, PharmD., BCPS Clinical Pharmacist Clinical phone for 11/27/18 until 3:30pm: [id number] If after 3:30pm, please refer to [facility] for unit-specific pharmacist

## 2018-11-27 NOTE — Telephone Encounter (Signed)
Called for  Symptom check  Just left Hospital. On Cephalexin twice a day for 10 days, increased to 3 a day for the rest of the 10 days. Call [facility] [phone] to fill additional 18 pills of Cephalexin  Spoke to Pharmacist  Per Rodell Perna PAC

## 2018-11-30 DIAGNOSIS — R109 Unspecified abdominal pain: Secondary | ICD-10-CM | POA: Diagnosis not present

## 2018-11-30 DIAGNOSIS — N12 Tubulo-interstitial nephritis, not specified as acute or chronic: Secondary | ICD-10-CM | POA: Diagnosis not present

## 2018-11-30 LAB — CULTURE, BLOOD (ROUTINE X 2)
Culture: NO GROWTH
Culture: NO GROWTH
Special Requests: ADEQUATE

## 2018-12-09 DIAGNOSIS — Z1231 Encounter for screening mammogram for malignant neoplasm of breast: Secondary | ICD-10-CM | POA: Diagnosis not present

## 2018-12-27 DIAGNOSIS — Z0120 Encounter for dental examination and cleaning without abnormal findings: Secondary | ICD-10-CM | POA: Diagnosis not present

## 2019-03-07 DIAGNOSIS — E039 Hypothyroidism, unspecified: Secondary | ICD-10-CM | POA: Diagnosis not present

## 2019-03-07 DIAGNOSIS — N289 Disorder of kidney and ureter, unspecified: Secondary | ICD-10-CM | POA: Diagnosis not present

## 2019-03-07 DIAGNOSIS — E785 Hyperlipidemia, unspecified: Secondary | ICD-10-CM | POA: Diagnosis not present

## 2019-03-07 DIAGNOSIS — I10 Essential (primary) hypertension: Secondary | ICD-10-CM | POA: Diagnosis not present

## 2019-04-04 DIAGNOSIS — J0110 Acute frontal sinusitis, unspecified: Secondary | ICD-10-CM | POA: Diagnosis not present

## 2019-04-26 DIAGNOSIS — E039 Hypothyroidism, unspecified: Secondary | ICD-10-CM | POA: Diagnosis not present

## 2019-07-04 DIAGNOSIS — M545 Low back pain: Secondary | ICD-10-CM | POA: Diagnosis not present

## 2019-07-04 DIAGNOSIS — N12 Tubulo-interstitial nephritis, not specified as acute or chronic: Secondary | ICD-10-CM | POA: Diagnosis not present

## 2019-07-04 DIAGNOSIS — R109 Unspecified abdominal pain: Secondary | ICD-10-CM | POA: Diagnosis not present

## 2019-08-04 DIAGNOSIS — M25511 Pain in right shoulder: Secondary | ICD-10-CM | POA: Diagnosis not present

## 2019-08-10 DIAGNOSIS — Z9889 Other specified postprocedural states: Secondary | ICD-10-CM | POA: Diagnosis not present

## 2019-08-10 DIAGNOSIS — H04123 Dry eye syndrome of bilateral lacrimal glands: Secondary | ICD-10-CM | POA: Diagnosis not present

## 2019-08-10 DIAGNOSIS — H43812 Vitreous degeneration, left eye: Secondary | ICD-10-CM | POA: Diagnosis not present

## 2019-08-16 DIAGNOSIS — G43109 Migraine with aura, not intractable, without status migrainosus: Secondary | ICD-10-CM | POA: Diagnosis not present

## 2019-08-16 DIAGNOSIS — H0102A Squamous blepharitis right eye, upper and lower eyelids: Secondary | ICD-10-CM | POA: Diagnosis not present

## 2019-08-16 DIAGNOSIS — H52202 Unspecified astigmatism, left eye: Secondary | ICD-10-CM | POA: Diagnosis not present

## 2019-08-16 DIAGNOSIS — H52213 Irregular astigmatism, bilateral: Secondary | ICD-10-CM | POA: Diagnosis not present

## 2019-08-16 DIAGNOSIS — H5213 Myopia, bilateral: Secondary | ICD-10-CM | POA: Diagnosis not present

## 2019-08-16 DIAGNOSIS — H0102B Squamous blepharitis left eye, upper and lower eyelids: Secondary | ICD-10-CM | POA: Diagnosis not present

## 2019-08-16 DIAGNOSIS — H04123 Dry eye syndrome of bilateral lacrimal glands: Secondary | ICD-10-CM | POA: Diagnosis not present

## 2019-08-16 DIAGNOSIS — H0288B Meibomian gland dysfunction left eye, upper and lower eyelids: Secondary | ICD-10-CM | POA: Diagnosis not present

## 2019-08-16 DIAGNOSIS — Z79899 Other long term (current) drug therapy: Secondary | ICD-10-CM | POA: Diagnosis not present

## 2019-08-16 DIAGNOSIS — H43813 Vitreous degeneration, bilateral: Secondary | ICD-10-CM | POA: Diagnosis not present

## 2019-08-16 DIAGNOSIS — H17823 Peripheral opacity of cornea, bilateral: Secondary | ICD-10-CM | POA: Diagnosis not present

## 2019-08-16 DIAGNOSIS — H0288A Meibomian gland dysfunction right eye, upper and lower eyelids: Secondary | ICD-10-CM | POA: Diagnosis not present

## 2019-08-16 DIAGNOSIS — R51 Headache: Secondary | ICD-10-CM | POA: Diagnosis not present

## 2019-08-16 DIAGNOSIS — H26493 Other secondary cataract, bilateral: Secondary | ICD-10-CM | POA: Diagnosis not present

## 2019-09-01 DIAGNOSIS — Z23 Encounter for immunization: Secondary | ICD-10-CM | POA: Diagnosis not present

## 2019-09-21 DIAGNOSIS — M810 Age-related osteoporosis without current pathological fracture: Secondary | ICD-10-CM | POA: Diagnosis not present

## 2019-09-21 DIAGNOSIS — E039 Hypothyroidism, unspecified: Secondary | ICD-10-CM | POA: Diagnosis not present

## 2019-09-21 DIAGNOSIS — N183 Chronic kidney disease, stage 3 (moderate): Secondary | ICD-10-CM | POA: Diagnosis not present

## 2019-09-21 DIAGNOSIS — I10 Essential (primary) hypertension: Secondary | ICD-10-CM | POA: Diagnosis not present

## 2019-09-27 DIAGNOSIS — H0288B Meibomian gland dysfunction left eye, upper and lower eyelids: Secondary | ICD-10-CM | POA: Diagnosis not present

## 2019-09-27 DIAGNOSIS — H0102A Squamous blepharitis right eye, upper and lower eyelids: Secondary | ICD-10-CM | POA: Diagnosis not present

## 2019-09-27 DIAGNOSIS — H2640 Unspecified secondary cataract: Secondary | ICD-10-CM | POA: Diagnosis not present

## 2019-09-27 DIAGNOSIS — H0288A Meibomian gland dysfunction right eye, upper and lower eyelids: Secondary | ICD-10-CM | POA: Diagnosis not present

## 2019-09-27 DIAGNOSIS — H04123 Dry eye syndrome of bilateral lacrimal glands: Secondary | ICD-10-CM | POA: Diagnosis not present

## 2019-09-27 DIAGNOSIS — H43813 Vitreous degeneration, bilateral: Secondary | ICD-10-CM | POA: Diagnosis not present

## 2019-09-27 DIAGNOSIS — H17823 Peripheral opacity of cornea, bilateral: Secondary | ICD-10-CM | POA: Diagnosis not present

## 2019-09-27 DIAGNOSIS — H0102B Squamous blepharitis left eye, upper and lower eyelids: Secondary | ICD-10-CM | POA: Diagnosis not present

## 2019-09-27 DIAGNOSIS — G43109 Migraine with aura, not intractable, without status migrainosus: Secondary | ICD-10-CM | POA: Diagnosis not present

## 2019-09-27 DIAGNOSIS — H5213 Myopia, bilateral: Secondary | ICD-10-CM | POA: Diagnosis not present

## 2019-09-27 DIAGNOSIS — H52202 Unspecified astigmatism, left eye: Secondary | ICD-10-CM | POA: Diagnosis not present

## 2019-09-27 DIAGNOSIS — H52213 Irregular astigmatism, bilateral: Secondary | ICD-10-CM | POA: Diagnosis not present

## 2019-09-28 DIAGNOSIS — E785 Hyperlipidemia, unspecified: Secondary | ICD-10-CM | POA: Diagnosis not present

## 2019-09-28 DIAGNOSIS — N1831 Chronic kidney disease, stage 3a: Secondary | ICD-10-CM | POA: Diagnosis not present

## 2019-09-28 DIAGNOSIS — E039 Hypothyroidism, unspecified: Secondary | ICD-10-CM | POA: Diagnosis not present

## 2019-11-21 DIAGNOSIS — Z0120 Encounter for dental examination and cleaning without abnormal findings: Secondary | ICD-10-CM | POA: Diagnosis not present

## 2019-11-29 DIAGNOSIS — Z7189 Other specified counseling: Secondary | ICD-10-CM | POA: Diagnosis not present

## 2019-11-29 DIAGNOSIS — N3000 Acute cystitis without hematuria: Secondary | ICD-10-CM | POA: Diagnosis not present

## 2019-12-10 IMAGING — DX DG LUMBAR SPINE COMPLETE 4+V
5 series · 5 of 5 positions shown · non-contrast
Comparison: Abdomen pelvis CT on 09/09/2017

CLINICAL DATA: Recurrent chronic left-sided low back pain.

EXAM:
LUMBAR SPINE - COMPLETE 4+ VIEW

[l-spine ap]
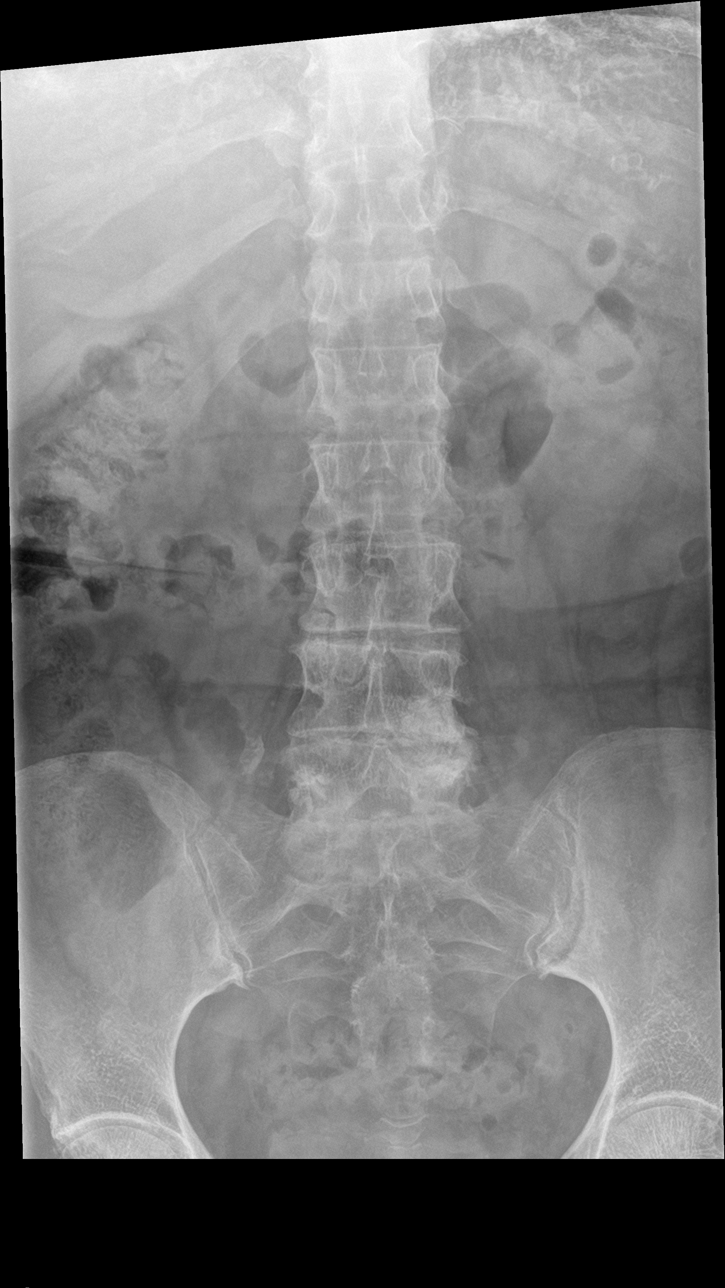

[l-spine obl (1 of 2)]
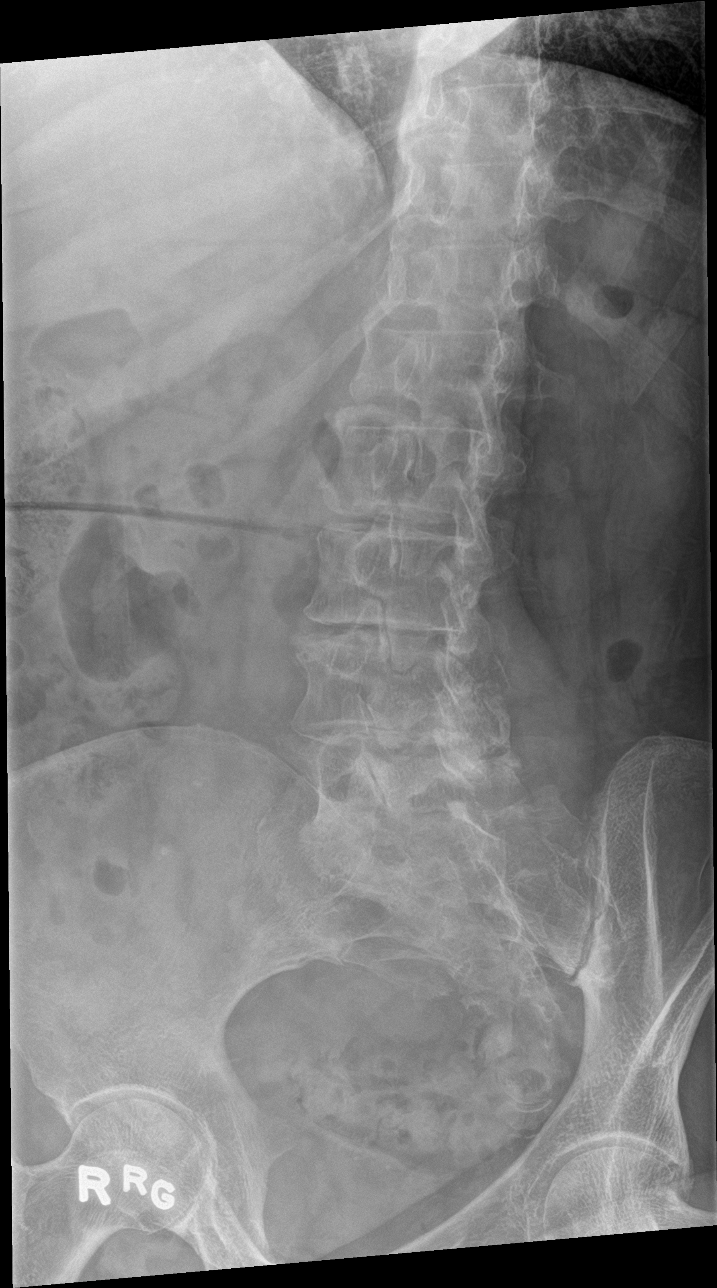

[l-spine obl (2 of 2)]
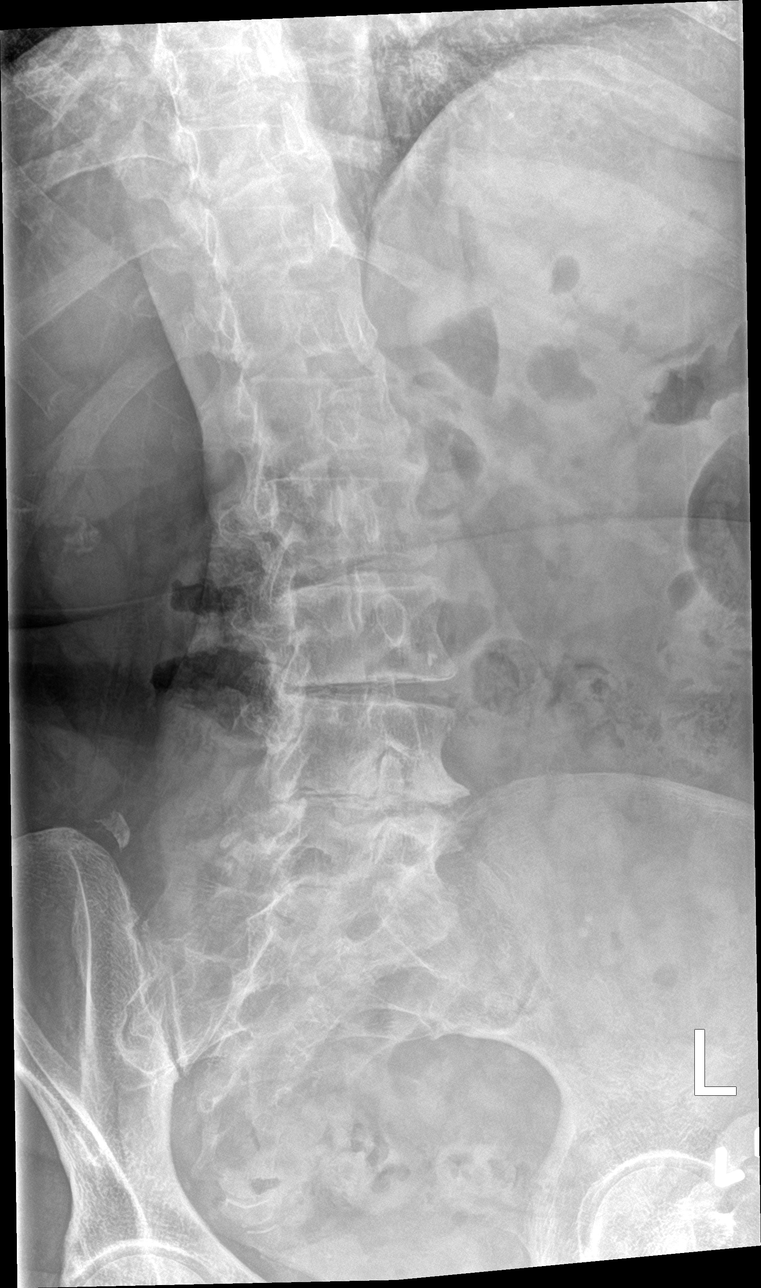

[l-spine lat]
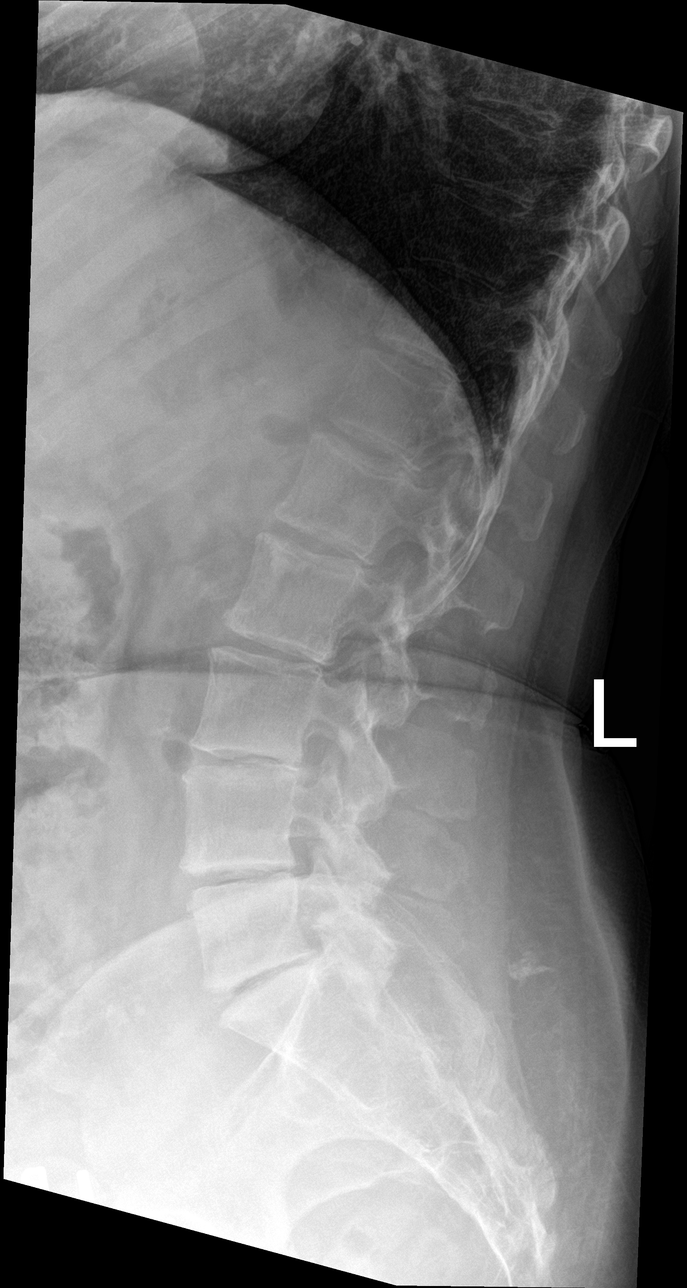

[l-spine spot]
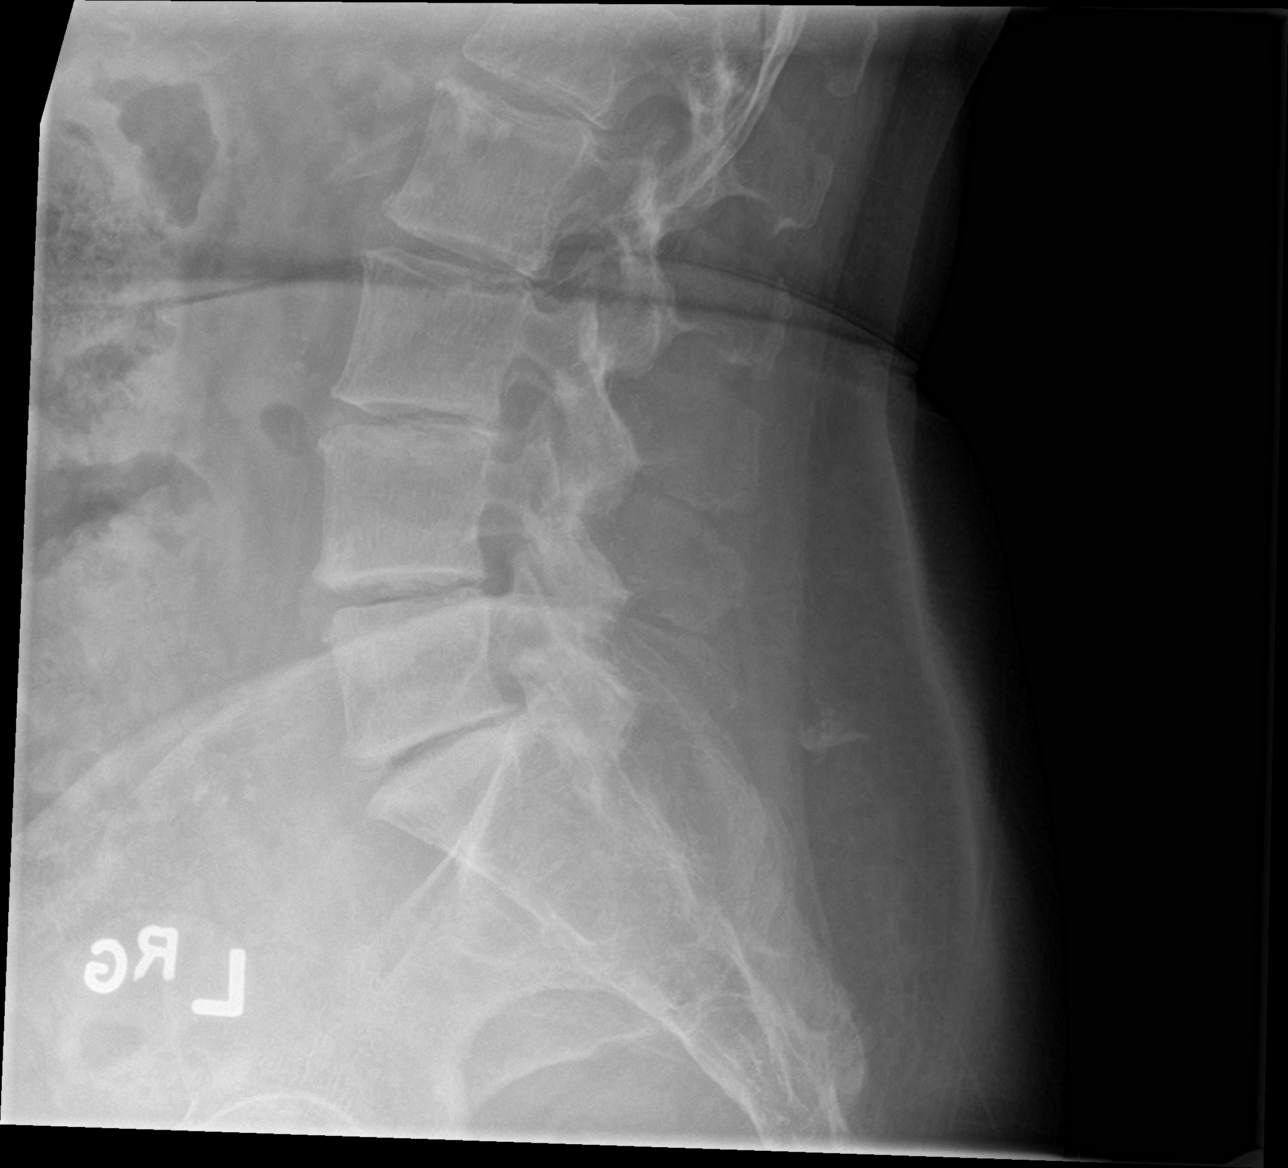

[5 of 5 positions shown; findings below may reference images not displayed]

FINDINGS: There is no evidence of lumbar spine fracture. Alignment is normal.
Moderate degenerative disc disease is seen from levels of L2-S1,
with vacuum disc phenomenon. Moderate to severe facet DJD is seen on
the left at L4-5 which shows progression since prior study. Moderate
right-sided facet DJD at L5-S1 also shows interval progression. No
focal lytic or sclerotic bone lesions identified.
IMPRESSION: No acute findings.

Degenerative spondylosis, with progressive lower lumbar facet DJD,
left side greater than right.

## 2019-12-14 DIAGNOSIS — E039 Hypothyroidism, unspecified: Secondary | ICD-10-CM | POA: Diagnosis not present

## 2019-12-14 DIAGNOSIS — I10 Essential (primary) hypertension: Secondary | ICD-10-CM | POA: Diagnosis not present

## 2019-12-14 DIAGNOSIS — M810 Age-related osteoporosis without current pathological fracture: Secondary | ICD-10-CM | POA: Diagnosis not present

## 2019-12-14 DIAGNOSIS — E785 Hyperlipidemia, unspecified: Secondary | ICD-10-CM | POA: Diagnosis not present

## 2020-01-03 DIAGNOSIS — Z1231 Encounter for screening mammogram for malignant neoplasm of breast: Secondary | ICD-10-CM | POA: Diagnosis not present

## 2020-02-21 DIAGNOSIS — H26493 Other secondary cataract, bilateral: Secondary | ICD-10-CM | POA: Diagnosis not present

## 2020-02-21 DIAGNOSIS — H04123 Dry eye syndrome of bilateral lacrimal glands: Secondary | ICD-10-CM | POA: Diagnosis not present

## 2020-02-21 DIAGNOSIS — H43813 Vitreous degeneration, bilateral: Secondary | ICD-10-CM | POA: Diagnosis not present

## 2020-02-21 DIAGNOSIS — H52202 Unspecified astigmatism, left eye: Secondary | ICD-10-CM | POA: Diagnosis not present

## 2020-02-21 DIAGNOSIS — G43109 Migraine with aura, not intractable, without status migrainosus: Secondary | ICD-10-CM | POA: Diagnosis not present

## 2020-02-21 DIAGNOSIS — H0102B Squamous blepharitis left eye, upper and lower eyelids: Secondary | ICD-10-CM | POA: Diagnosis not present

## 2020-02-21 DIAGNOSIS — H0288B Meibomian gland dysfunction left eye, upper and lower eyelids: Secondary | ICD-10-CM | POA: Diagnosis not present

## 2020-02-21 DIAGNOSIS — H52213 Irregular astigmatism, bilateral: Secondary | ICD-10-CM | POA: Diagnosis not present

## 2020-02-21 DIAGNOSIS — H0102A Squamous blepharitis right eye, upper and lower eyelids: Secondary | ICD-10-CM | POA: Diagnosis not present

## 2020-02-21 DIAGNOSIS — Z79899 Other long term (current) drug therapy: Secondary | ICD-10-CM | POA: Diagnosis not present

## 2020-02-21 DIAGNOSIS — H5213 Myopia, bilateral: Secondary | ICD-10-CM | POA: Diagnosis not present

## 2020-02-21 DIAGNOSIS — H0288A Meibomian gland dysfunction right eye, upper and lower eyelids: Secondary | ICD-10-CM | POA: Diagnosis not present

## 2020-05-11 DIAGNOSIS — Z96651 Presence of right artificial knee joint: Secondary | ICD-10-CM | POA: Diagnosis not present

## 2020-05-11 DIAGNOSIS — Z471 Aftercare following joint replacement surgery: Secondary | ICD-10-CM | POA: Diagnosis not present

## 2020-06-11 DIAGNOSIS — Z0120 Encounter for dental examination and cleaning without abnormal findings: Secondary | ICD-10-CM | POA: Diagnosis not present

## 2020-07-03 DIAGNOSIS — F419 Anxiety disorder, unspecified: Secondary | ICD-10-CM | POA: Diagnosis not present

## 2020-07-03 DIAGNOSIS — E039 Hypothyroidism, unspecified: Secondary | ICD-10-CM | POA: Diagnosis not present

## 2020-07-03 DIAGNOSIS — N183 Chronic kidney disease, stage 3 unspecified: Secondary | ICD-10-CM | POA: Diagnosis not present

## 2020-07-03 DIAGNOSIS — R5383 Other fatigue: Secondary | ICD-10-CM | POA: Diagnosis not present

## 2020-08-21 DIAGNOSIS — M810 Age-related osteoporosis without current pathological fracture: Secondary | ICD-10-CM | POA: Diagnosis not present

## 2020-08-21 DIAGNOSIS — E785 Hyperlipidemia, unspecified: Secondary | ICD-10-CM | POA: Diagnosis not present

## 2020-08-21 DIAGNOSIS — E039 Hypothyroidism, unspecified: Secondary | ICD-10-CM | POA: Diagnosis not present

## 2020-08-21 DIAGNOSIS — I10 Essential (primary) hypertension: Secondary | ICD-10-CM | POA: Diagnosis not present

## 2020-09-04 DIAGNOSIS — E039 Hypothyroidism, unspecified: Secondary | ICD-10-CM | POA: Diagnosis not present

## 2020-09-04 DIAGNOSIS — Z79899 Other long term (current) drug therapy: Secondary | ICD-10-CM | POA: Diagnosis not present

## 2020-09-04 DIAGNOSIS — E785 Hyperlipidemia, unspecified: Secondary | ICD-10-CM | POA: Diagnosis not present

## 2020-09-27 DIAGNOSIS — M545 Low back pain, unspecified: Secondary | ICD-10-CM | POA: Diagnosis not present

## 2020-09-27 DIAGNOSIS — M5416 Radiculopathy, lumbar region: Secondary | ICD-10-CM | POA: Diagnosis not present

## 2020-09-27 DIAGNOSIS — M25551 Pain in right hip: Secondary | ICD-10-CM | POA: Diagnosis not present

## 2020-10-10 DIAGNOSIS — Z23 Encounter for immunization: Secondary | ICD-10-CM | POA: Diagnosis not present

## 2020-11-12 DIAGNOSIS — Z79899 Other long term (current) drug therapy: Secondary | ICD-10-CM | POA: Diagnosis not present

## 2020-11-12 DIAGNOSIS — E785 Hyperlipidemia, unspecified: Secondary | ICD-10-CM | POA: Diagnosis not present

## 2021-01-14 ENCOUNTER — Ambulatory Visit (AMBULATORY_SURGERY_CENTER): Payer: Medicare Other

## 2021-01-14 ENCOUNTER — Other Ambulatory Visit: Payer: Self-pay

## 2021-01-14 VITALS — Ht 61.0 in | Wt 141.0 lb

## 2021-01-14 DIAGNOSIS — Z1211 Encounter for screening for malignant neoplasm of colon: Secondary | ICD-10-CM

## 2021-01-14 MED ORDER — PLENVU 140 G PO SOLR: 1.0000 | ORAL | 0 refills | Status: DC

## 2021-01-14 NOTE — Progress Notes (Signed)
Pre visit appt completed via phone; patient verified name, DOB, and address;  No egg or soy allergy known to patient  No issues with past sedation with any surgeries or procedures No intubation problems in the past  No FH of Malignant Hyperthermia No diet pills per patient No home 02 use per patient  No blood thinners per patient  Pt denies issues with constipation  No A fib or A flutter  COVID 19 guidelines implemented in [facility] today with Pt and RN  Pt is fully vaccinated  for Covid x 2 + booster; Coupon given to pt in PV today , Code to Pharmacy  Due to the COVID-19 pandemic we are asking patients to follow certain guidelines.  Pt aware of COVID protocols and [facility] guidelines

## 2021-01-18 ENCOUNTER — Telehealth: Payer: Self-pay | Admitting: Gastroenterology

## 2021-01-18 NOTE — Telephone Encounter (Signed)
Spoke with the pharmacist and gave them a Plenvu medicare coupon that I activated. The price for Plenvu is now $60. Call pt, no answer, left message regarding this information.

## 2021-01-18 NOTE — Telephone Encounter (Signed)
Inbound call from pharmacy stating even with coupon Plenvu will be over $150 for patient and is asking if an alternate medication can be sent in.  Please advise.

## 2021-01-22 ENCOUNTER — Encounter: Payer: Self-pay | Admitting: Gastroenterology

## 2021-01-22 DIAGNOSIS — Z1231 Encounter for screening mammogram for malignant neoplasm of breast: Secondary | ICD-10-CM | POA: Diagnosis not present

## 2021-01-28 ENCOUNTER — Ambulatory Visit (AMBULATORY_SURGERY_CENTER): Payer: Medicare Other | Admitting: Gastroenterology

## 2021-01-28 ENCOUNTER — Other Ambulatory Visit: Payer: Self-pay

## 2021-01-28 ENCOUNTER — Encounter: Payer: Self-pay | Admitting: Gastroenterology

## 2021-01-28 VITALS — BP 131/77 | HR 90 | Temp 97.7°F | Resp 21 | Ht 61.0 in | Wt 141.0 lb

## 2021-01-28 DIAGNOSIS — Z1211 Encounter for screening for malignant neoplasm of colon: Secondary | ICD-10-CM

## 2021-01-28 DIAGNOSIS — N183 Chronic kidney disease, stage 3 unspecified: Secondary | ICD-10-CM | POA: Diagnosis not present

## 2021-01-28 DIAGNOSIS — D120 Benign neoplasm of cecum: Secondary | ICD-10-CM | POA: Diagnosis not present

## 2021-01-28 DIAGNOSIS — D122 Benign neoplasm of ascending colon: Secondary | ICD-10-CM

## 2021-01-28 DIAGNOSIS — D123 Benign neoplasm of transverse colon: Secondary | ICD-10-CM

## 2021-01-28 DIAGNOSIS — E039 Hypothyroidism, unspecified: Secondary | ICD-10-CM | POA: Diagnosis not present

## 2021-01-28 DIAGNOSIS — I10 Essential (primary) hypertension: Secondary | ICD-10-CM | POA: Diagnosis not present

## 2021-01-28 MED ORDER — SODIUM CHLORIDE 0.9 % IV SOLN: 500.0000 mL | Freq: Once | INTRAVENOUS | Status: DC

## 2021-01-28 NOTE — Progress Notes (Signed)
Called to room to assist during endoscopic procedure.  Patient ID and intended procedure confirmed with present staff. Received instructions for my participation in the procedure from the performing physician.  

## 2021-01-28 NOTE — Patient Instructions (Signed)
Read all of the handouts given to you by your recovery room nurse.  Resume all of your medications today.  YOU HAD AN ENDOSCOPIC PROCEDURE TODAY AT [facility]:   Refer to the procedure report that was given to you for any specific questions about what was found during the examination.  If the procedure report does not answer your questions, please call your gastroenterologist to clarify.  If you requested that your care partner not be given the details of your procedure findings, then the procedure report has been included in a sealed envelope for you to review at your convenience later.  YOU SHOULD EXPECT: Some feelings of bloating in the abdomen. Passage of more gas than usual.  Walking can help get rid of the air that was put into your GI tract during the procedure and reduce the bloating. If you had a lower endoscopy (such as a colonoscopy or flexible sigmoidoscopy) you may notice spotting of blood in your stool or on the toilet paper. If you underwent a bowel prep for your procedure, you may not have a normal bowel movement for a few days.  Please Note:  You might notice some irritation and congestion in your nose or some drainage.  This is from the oxygen used during your procedure.  There is no need for concern and it should clear up in a day or so.  SYMPTOMS TO REPORT IMMEDIATELY:   Following lower endoscopy (colonoscopy or flexible sigmoidoscopy):  Excessive amounts of blood in the stool  Significant tenderness or worsening of abdominal pains  Swelling of the abdomen that is new, acute  Fever of 100F or higher    For urgent or emergent issues, a gastroenterologist can be reached at any hour by calling [phone]. Do not use MyChart messaging for urgent concerns.    DIET:  We do recommend a small meal at first, but then you may proceed to your regular diet.  Drink plenty of fluids but you should avoid alcoholic beverages for 24 hours. Try to increase the fiber in  your diet, and drink plenty of water.  ACTIVITY:  You should plan to take it easy for the rest of today and you should NOT DRIVE or use heavy machinery until tomorrow (because of the sedation medicines used during the test).    FOLLOW UP: Our staff will call the number listed on your records 48-72 hours following your procedure to check on you and address any questions or concerns that you may have regarding the information given to you following your procedure. If we do not reach you, we will leave a message.  We will attempt to reach you two times.  During this call, we will ask if you have developed any symptoms of COVID 19. If you develop any symptoms (ie: fever, flu-like symptoms, shortness of breath, cough etc.) before then, please call [phone].  If you test positive for Covid 19 in the 2 weeks post procedure, please call and report this information to [place].    If any biopsies were taken you will be contacted by phone or by letter within the next 1-3 weeks.  Please call us at [phone] if you have not heard about the biopsies in 3 weeks.    SIGNATURES/CONFIDENTIALITY: You and/or your care partner have signed paperwork which will be entered into your electronic medical record.  These signatures attest to the fact that that the information above on your After Visit Summary has been reviewed and is  understood.  Full responsibility of the confidentiality of this discharge information lies with you and/or your care-partner. 

## 2021-01-28 NOTE — Progress Notes (Signed)
Report to PACU, RN, vss, BBS= Clear.  

## 2021-01-28 NOTE — Progress Notes (Signed)
Vs by CW.  previsit done by BG.  Pt states no changes to health hx since previsit

## 2021-01-28 NOTE — Op Note (Signed)
[facility] Patient Name: [name] Procedure Date: 01/28/2021 1:27 PM MRN: [id number] Endoscopist: Meryl Dare , MD Age: 75 Referring MD:  Date of Birth: Oct 10, 1946 Gender: Female Account #: [id number] Procedure:                Colonoscopy Indications:              Screening for colorectal malignant neoplasm Medicines:                Monitored Anesthesia Care Procedure:                Pre-Anesthesia Assessment:                           - Prior to the procedure, a History and Physical                            was performed, and patient medications and                            allergies were reviewed. The patient's tolerance of                            previous anesthesia was also reviewed. The risks                            and benefits of the procedure and the sedation                            options and risks were discussed with the patient.                            All questions were answered, and informed consent                            was obtained. Prior Anticoagulants: The patient has                            taken no previous anticoagulant or antiplatelet                            agents. ASA Grade Assessment: II - A patient with                            mild systemic disease. After reviewing the risks                            and benefits, the patient was deemed in                            satisfactory condition to undergo the procedure.                           After obtaining informed consent, the colonoscope  was passed under direct vision. Throughout the                            procedure, the patient's blood pressure, pulse, and                            oxygen saturations were monitored continuously. The                            Olympus [id number]-[id number] (#[id number]) Colonoscope was                            introduced through the anus and advanced to the the                            cecum, identified  by appendiceal orifice and                            ileocecal valve. The ileocecal valve, appendiceal                            orifice, and rectum were photographed. The quality                            of the bowel preparation was good. The colonoscopy                            was performed without difficulty. The patient                            tolerated the procedure well. Scope In: 1:29:41 PM Scope Out: 1:53:36 PM Scope Withdrawal Time: 0 hours 18 minutes 24 seconds  Total Procedure Duration: 0 hours 23 minutes 55 seconds  Findings:                 The perianal and digital rectal examinations were                            normal.                           A 3 mm polyp was found in the cecum. The polyp was                            sessile. The polyp was removed with a cold biopsy                            forceps. Resection and retrieval were complete.                           Three sessile polyps were found in the transverse                            colon (2) and ascending colon (1). The polyps were  7 to 8 mm in size. These polyps were removed with a                            cold snare. Resection and retrieval were complete.                           Multiple small-mouthed diverticula were found in                            the left colon. There was no evidence of                            diverticular bleeding.                           Internal hemorrhoids were found during                            retroflexion. The hemorrhoids were small and Grade                            I (internal hemorrhoids that do not prolapse).                           The exam was otherwise without abnormality on                            direct and retroflexion views. Complications:            No immediate complications. Estimated blood loss:                            None. Estimated Blood Loss:     Estimated blood loss: none. Impression:                - One 3 mm polyp in the cecum, removed with a cold                            biopsy forceps. Resected and retrieved.                           - Three 7 to 8 mm polyps in the transverse colon                            and in the ascending colon, removed with a cold                            snare. Resected and retrieved.                           - Moderate diverticulosis in the left colon.                           - Internal hemorrhoids.                           -  The examination was otherwise normal on direct                            and retroflexion views. Recommendation:           - Repeat colonoscopy, vs no repeat colonoscopy due                            to age, after studies are complete for surveillance                            based on pathology results.                           - Patient has a contact number available for                            emergencies. The signs and symptoms of potential                            delayed complications were discussed with the                            patient. Return to normal activities tomorrow.                            Written discharge instructions were provided to the                            patient.                           - High fiber diet.                           - Continue present medications.                           - Await pathology results. Meryl Dare, MD 01/28/2021 1:58:55 PM This report has been signed electronically.

## 2021-01-30 ENCOUNTER — Telehealth: Payer: Self-pay | Admitting: *Deleted

## 2021-01-30 NOTE — Telephone Encounter (Signed)
  Follow up Call-  Call back number 01/28/2021  Post procedure Call Back phone  # [phone]  Permission to leave phone message Yes  Some recent data might be hidden     Patient questions:  Do you have a fever, pain , or abdominal swelling? No. Pain Score  0 *  Have you tolerated food without any problems? No.  Have you been able to return to your normal activities? Yes.    Do you have any questions about your discharge instructions: Diet   No. Medications  No. Follow up visit  No.  Do you have questions or concerns about your Care? No.  Actions: * If pain score is 4 or above: No action needed, pain <4.  1. Have you developed a fever since your procedure? no  2.   Have you had an respiratory symptoms (SOB or cough) since your procedure? no  3.   Have you tested positive for COVID 19 since your procedure no  4.   Have you had any family members/close contacts diagnosed with the COVID 19 since your procedure?  no   If yes to any of these questions please route to Joylene John, RN and Joella Prince, RN

## 2021-02-11 DIAGNOSIS — H524 Presbyopia: Secondary | ICD-10-CM | POA: Diagnosis not present

## 2021-02-21 ENCOUNTER — Encounter: Payer: Self-pay | Admitting: Gastroenterology

## 2021-05-16 DIAGNOSIS — J0140 Acute pansinusitis, unspecified: Secondary | ICD-10-CM | POA: Diagnosis not present

## 2021-05-16 DIAGNOSIS — R059 Cough, unspecified: Secondary | ICD-10-CM | POA: Diagnosis not present

## 2021-07-01 DIAGNOSIS — U071 COVID-19: Secondary | ICD-10-CM | POA: Diagnosis not present

## 2021-07-01 DIAGNOSIS — N183 Chronic kidney disease, stage 3 unspecified: Secondary | ICD-10-CM | POA: Diagnosis not present

## 2021-07-29 DIAGNOSIS — E039 Hypothyroidism, unspecified: Secondary | ICD-10-CM | POA: Diagnosis not present

## 2021-07-29 DIAGNOSIS — E785 Hyperlipidemia, unspecified: Secondary | ICD-10-CM | POA: Diagnosis not present

## 2021-07-29 DIAGNOSIS — Z1159 Encounter for screening for other viral diseases: Secondary | ICD-10-CM | POA: Diagnosis not present

## 2021-07-29 DIAGNOSIS — N183 Chronic kidney disease, stage 3 unspecified: Secondary | ICD-10-CM | POA: Diagnosis not present

## 2021-07-29 DIAGNOSIS — I129 Hypertensive chronic kidney disease with stage 1 through stage 4 chronic kidney disease, or unspecified chronic kidney disease: Secondary | ICD-10-CM | POA: Diagnosis not present

## 2021-09-25 DIAGNOSIS — Z23 Encounter for immunization: Secondary | ICD-10-CM | POA: Diagnosis not present

## 2021-09-25 DIAGNOSIS — E785 Hyperlipidemia, unspecified: Secondary | ICD-10-CM | POA: Diagnosis not present

## 2021-09-25 DIAGNOSIS — Z79899 Other long term (current) drug therapy: Secondary | ICD-10-CM | POA: Diagnosis not present

## 2022-01-28 DIAGNOSIS — R8290 Unspecified abnormal findings in urine: Secondary | ICD-10-CM | POA: Diagnosis not present

## 2022-01-28 DIAGNOSIS — N183 Chronic kidney disease, stage 3 unspecified: Secondary | ICD-10-CM | POA: Diagnosis not present

## 2022-01-28 DIAGNOSIS — E785 Hyperlipidemia, unspecified: Secondary | ICD-10-CM | POA: Diagnosis not present

## 2022-01-28 DIAGNOSIS — I129 Hypertensive chronic kidney disease with stage 1 through stage 4 chronic kidney disease, or unspecified chronic kidney disease: Secondary | ICD-10-CM | POA: Diagnosis not present

## 2022-01-28 DIAGNOSIS — I700 Atherosclerosis of aorta: Secondary | ICD-10-CM | POA: Diagnosis not present

## 2022-02-04 DIAGNOSIS — Z1231 Encounter for screening mammogram for malignant neoplasm of breast: Secondary | ICD-10-CM | POA: Diagnosis not present

## 2022-02-25 DIAGNOSIS — H524 Presbyopia: Secondary | ICD-10-CM | POA: Diagnosis not present

## 2022-03-31 DIAGNOSIS — M65312 Trigger thumb, left thumb: Secondary | ICD-10-CM | POA: Diagnosis not present

## 2022-04-21 DIAGNOSIS — K625 Hemorrhage of anus and rectum: Secondary | ICD-10-CM | POA: Diagnosis not present

## 2022-04-21 DIAGNOSIS — R197 Diarrhea, unspecified: Secondary | ICD-10-CM | POA: Diagnosis not present

## 2022-04-21 DIAGNOSIS — R194 Change in bowel habit: Secondary | ICD-10-CM | POA: Diagnosis not present

## 2022-04-21 DIAGNOSIS — R109 Unspecified abdominal pain: Secondary | ICD-10-CM | POA: Diagnosis not present

## 2022-04-23 DIAGNOSIS — R194 Change in bowel habit: Secondary | ICD-10-CM | POA: Diagnosis not present

## 2022-04-23 DIAGNOSIS — R197 Diarrhea, unspecified: Secondary | ICD-10-CM | POA: Diagnosis not present

## 2022-05-06 DIAGNOSIS — M65312 Trigger thumb, left thumb: Secondary | ICD-10-CM | POA: Diagnosis not present

## 2022-05-08 ENCOUNTER — Ambulatory Visit: Payer: Medicare Other | Admitting: Physician Assistant

## 2022-08-08 DIAGNOSIS — R2232 Localized swelling, mass and lump, left upper limb: Secondary | ICD-10-CM | POA: Diagnosis not present

## 2022-08-08 DIAGNOSIS — M65312 Trigger thumb, left thumb: Secondary | ICD-10-CM | POA: Diagnosis not present

## 2022-08-18 ENCOUNTER — Other Ambulatory Visit: Payer: Self-pay

## 2022-08-18 DIAGNOSIS — M65312 Trigger thumb, left thumb: Secondary | ICD-10-CM | POA: Diagnosis not present

## 2022-08-18 DIAGNOSIS — G8918 Other acute postprocedural pain: Secondary | ICD-10-CM | POA: Diagnosis not present

## 2022-08-18 DIAGNOSIS — D1722 Benign lipomatous neoplasm of skin and subcutaneous tissue of left arm: Secondary | ICD-10-CM | POA: Diagnosis not present

## 2022-09-23 DIAGNOSIS — Z6828 Body mass index (BMI) 28.0-28.9, adult: Secondary | ICD-10-CM | POA: Diagnosis not present

## 2022-09-23 DIAGNOSIS — H66001 Acute suppurative otitis media without spontaneous rupture of ear drum, right ear: Secondary | ICD-10-CM | POA: Diagnosis not present

## 2022-11-06 DIAGNOSIS — Z0000 Encounter for general adult medical examination without abnormal findings: Secondary | ICD-10-CM | POA: Diagnosis not present

## 2022-11-06 DIAGNOSIS — E039 Hypothyroidism, unspecified: Secondary | ICD-10-CM | POA: Diagnosis not present

## 2022-11-06 DIAGNOSIS — I129 Hypertensive chronic kidney disease with stage 1 through stage 4 chronic kidney disease, or unspecified chronic kidney disease: Secondary | ICD-10-CM | POA: Diagnosis not present

## 2022-11-06 DIAGNOSIS — N183 Chronic kidney disease, stage 3 unspecified: Secondary | ICD-10-CM | POA: Diagnosis not present

## 2022-11-06 DIAGNOSIS — E785 Hyperlipidemia, unspecified: Secondary | ICD-10-CM | POA: Diagnosis not present

## 2022-11-06 DIAGNOSIS — M810 Age-related osteoporosis without current pathological fracture: Secondary | ICD-10-CM | POA: Diagnosis not present

## 2023-01-01 DIAGNOSIS — E039 Hypothyroidism, unspecified: Secondary | ICD-10-CM | POA: Diagnosis not present

## 2023-01-08 DIAGNOSIS — K080 Exfoliation of teeth due to systemic causes: Secondary | ICD-10-CM | POA: Diagnosis not present

## 2023-01-27 DIAGNOSIS — K080 Exfoliation of teeth due to systemic causes: Secondary | ICD-10-CM | POA: Diagnosis not present

## 2023-02-10 DIAGNOSIS — Z1231 Encounter for screening mammogram for malignant neoplasm of breast: Secondary | ICD-10-CM | POA: Diagnosis not present

## 2023-02-13 DIAGNOSIS — R197 Diarrhea, unspecified: Secondary | ICD-10-CM | POA: Diagnosis not present

## 2023-02-13 DIAGNOSIS — M7989 Other specified soft tissue disorders: Secondary | ICD-10-CM | POA: Diagnosis not present

## 2023-02-16 DIAGNOSIS — R197 Diarrhea, unspecified: Secondary | ICD-10-CM | POA: Diagnosis not present

## 2023-02-17 ENCOUNTER — Other Ambulatory Visit (HOSPITAL_COMMUNITY): Payer: Self-pay | Admitting: Family Medicine

## 2023-02-17 ENCOUNTER — Ambulatory Visit (HOSPITAL_COMMUNITY)
Admission: RE | Admit: 2023-02-17 | Discharge: 2023-02-17 | Disposition: A | Discharge status: Home / Self Care | Payer: Medicare Other | Source: Ambulatory Visit | Attending: Vascular Surgery | Admitting: Vascular Surgery

## 2023-02-17 DIAGNOSIS — M7989 Other specified soft tissue disorders: Secondary | ICD-10-CM | POA: Insufficient documentation

## 2023-02-17 DIAGNOSIS — M79606 Pain in leg, unspecified: Secondary | ICD-10-CM | POA: Insufficient documentation

## 2023-02-17 DIAGNOSIS — M79604 Pain in right leg: Secondary | ICD-10-CM | POA: Diagnosis not present

## 2023-02-17 DIAGNOSIS — E039 Hypothyroidism, unspecified: Secondary | ICD-10-CM | POA: Diagnosis not present

## 2023-03-03 DIAGNOSIS — H524 Presbyopia: Secondary | ICD-10-CM | POA: Diagnosis not present

## 2023-04-01 ENCOUNTER — Other Ambulatory Visit (INDEPENDENT_AMBULATORY_CARE_PROVIDER_SITE_OTHER): Payer: Medicare Other

## 2023-04-01 ENCOUNTER — Encounter: Payer: Self-pay | Admitting: Gastroenterology

## 2023-04-01 ENCOUNTER — Ambulatory Visit: Payer: Medicare Other | Admitting: Gastroenterology

## 2023-04-01 VITALS — BP 122/76 | HR 88 | Ht 61.0 in | Wt 146.2 lb

## 2023-04-01 DIAGNOSIS — R194 Change in bowel habit: Secondary | ICD-10-CM

## 2023-04-01 DIAGNOSIS — R1032 Left lower quadrant pain: Secondary | ICD-10-CM | POA: Diagnosis not present

## 2023-04-01 DIAGNOSIS — R634 Abnormal weight loss: Secondary | ICD-10-CM | POA: Diagnosis not present

## 2023-04-01 DIAGNOSIS — R197 Diarrhea, unspecified: Secondary | ICD-10-CM

## 2023-04-01 LAB — CBC WITH DIFFERENTIAL/PLATELET
Basophils Absolute: 0.1 10*3/uL (ref 0.0–0.1)
Basophils Relative: 1.2 % (ref 0.0–3.0)
Eosinophils Absolute: 0.2 10*3/uL (ref 0.0–0.7)
Eosinophils Relative: 2 % (ref 0.0–5.0)
HCT: 39.3 % (ref 36.0–46.0)
Hemoglobin: 13.6 g/dL (ref 12.0–15.0)
Lymphocytes Relative: 23.6 % (ref 12.0–46.0)
Lymphs Abs: 1.8 10*3/uL (ref 0.7–4.0)
MCHC: 34.7 g/dL (ref 30.0–36.0)
MCV: 85.8 fl (ref 78.0–100.0)
Monocytes Absolute: 0.4 10*3/uL (ref 0.1–1.0)
Monocytes Relative: 5.7 % (ref 3.0–12.0)
Neutro Abs: 5.1 10*3/uL (ref 1.4–7.7)
Neutrophils Relative %: 67.5 % (ref 43.0–77.0)
Platelets: 308 10*3/uL (ref 150.0–400.0)
RBC: 4.58 Mil/uL (ref 3.87–5.11)
RDW: 13.3 % (ref 11.5–15.5)
WBC: 7.6 10*3/uL (ref 4.0–10.5)

## 2023-04-01 LAB — COMPREHENSIVE METABOLIC PANEL
ALT: 9 U/L (ref 0–35)
AST: 16 U/L (ref 0–37)
Albumin: 4.4 g/dL (ref 3.5–5.2)
Alkaline Phosphatase: 52 U/L (ref 39–117)
BUN: 12 mg/dL (ref 6–23)
CO2: 29 mEq/L (ref 19–32)
Calcium: 9.5 mg/dL (ref 8.4–10.5)
Chloride: 98 mEq/L (ref 96–112)
Creatinine, Ser: 1.13 mg/dL (ref 0.40–1.20)
GFR: 47.15 mL/min — ABNORMAL LOW (ref >60.00)
Glucose, Bld: 97 mg/dL (ref 70–99)
Potassium: 3.1 mEq/L — ABNORMAL LOW (ref 3.5–5.1)
Sodium: 138 mEq/L (ref 135–145)
Total Bilirubin: 0.3 mg/dL (ref 0.2–1.2)
Total Protein: 7.1 g/dL (ref 6.0–8.3)

## 2023-04-01 LAB — C-REACTIVE PROTEIN: CRP: <1 mg/dL (ref 0.5–20.0)

## 2023-04-01 LAB — TSH: TSH: 0.81 u[IU]/mL (ref 0.35–5.50)

## 2023-04-01 LAB — SEDIMENTATION RATE: Sed Rate: 9 mm/hr (ref 0–30)

## 2023-04-01 NOTE — Patient Instructions (Addendum)
Your provider has requested that you go to the basement level for lab work before leaving today. Press "B" on the elevator. The lab is located at the first door on the left as you exit the elevator.  Your provider has ordered "Diatherix" stool testing for you. You have received a kit from our office today containing all necessary supplies to complete this test. Please carefully read the stool collection instructions provided in the kit before opening the accompanying materials. In addition, be sure to place the label from the top left corner of the laboratory request sheet onto the "puritan opti-swab" tube that is supplied in the kit. This label should include your full name and date of birth. After completing the test, you should secure the purtian tube into the specimen biohazard bag. The laboratory request information sheet (including date and time of specimen collection) should be placed into the outside pocket of the specimen biohazard bag and returned to the [facility] lab with 2 days of collection.   You have been given a low-fodmap diet to follow.  You have been scheduled for a CT scan of the abdomen and pelvis at [facility], 1st floor Radiology. You are scheduled on 04/21/23 at 12:30pm. You should arrive 10:15 am minutes prior to your appointment time for registration and to drink oral contrast.  Please follow the written instructions below on the day of your exam:   1) Do not eat anything after 8:30am (4 hours prior to your test)     You may take any medications as prescribed with a small amount of water, if necessary. If you take any of the following medications: METFORMIN, GLUCOPHAGE, GLUCOVANCE, AVANDAMET, RIOMET, FORTAMET, ACTOPLUS MET, JANUMET, GLUMETZA or METAGLIP, you MAY be asked to HOLD this medication 48 hours AFTER the exam.   The purpose of you drinking the oral contrast is to aid in the visualization of your intestinal tract. The contrast solution may cause some diarrhea.  Depending on your individual set of symptoms, you may also receive an intravenous injection of x-ray contrast/dye. Plan on being at [facility] for 45 minutes or longer, depending on the type of exam you are having performed.   If you have any questions regarding your exam or if you need to reschedule, you may call [facility] at [phone] between the hours of 8:00 am and 5:00 pm, Monday-Friday.   Kegel Exercises  Kegel exercises can help strengthen your pelvic floor muscles. The pelvic floor is a group of muscles that support your rectum, small intestine, and bladder. In females, pelvic floor muscles also help support the uterus. These muscles help you control the flow of urine and stool (feces). Kegel exercises are painless and simple. They do not require any equipment. Your provider may suggest Kegel exercises to: Improve bladder and bowel control. Improve sexual response. Improve weak pelvic floor muscles after surgery to remove the uterus (hysterectomy) or after pregnancy, in females. Improve weak pelvic floor muscles after prostate gland removal or surgery, in males. Kegel exercises involve squeezing your pelvic floor muscles. These are the same muscles you squeeze when you try to stop the flow of urine or keep from passing gas. The exercises can be done while sitting, standing, or lying down, but it is best to vary your position. Ask your health care provider which exercises are safe for you. Do exercises exactly as told by your health care provider and adjust them as directed. Do not begin these exercises until told by your  health care provider. Exercises How to do Kegel exercises: Squeeze your pelvic floor muscles tight. You should feel a tight lift in your rectal area. If you are a female, you should also feel a tightness in your vaginal area. Keep your stomach, buttocks, and legs relaxed. Hold the muscles tight for up to 10 seconds. Breathe normally. Relax your muscles for  up to 10 seconds. Repeat as told by your health care provider. Repeat this exercise daily as told by your health care provider. Continue to do this exercise for at least 4-6 weeks, or for as long as told by your health care provider. You may be referred to a physical therapist who can help you learn more about how to do Kegel exercises. Depending on your condition, your health care provider may recommend: Varying how long you squeeze your muscles. Doing several sets of exercises every day. Doing exercises for several weeks. Making Kegel exercises a part of your regular exercise routine. This information is not intended to replace advice given to you by your health care provider. Make sure you discuss any questions you have with your health care provider. Document Revised: 04/18/2021 Document Reviewed: 04/18/2021 Elsevier Patient Education  2023 Else[employer] Inc.  The [facility] providers would like to encourage you to use [facility] to communicate with providers for non-urgent requests or questions.  Due to long hold times on the telephone, sending your provider a message by [facility] may be a faster and more efficient way to get a response.  Please allow 48 business hours for a response.  Please remember that this is for non-urgent requests.   Due to recent changes in healthcare laws, you may see the results of your imaging and laboratory studies on MyChart before your provider has had a chance to review them.  We understand that in some cases there may be results that are confusing or concerning to you. Not all laboratory results come back in the same time frame and the provider may be waiting for multiple results in order to interpret others.  Please give [place] 48 hours in order for your provider to thoroughly review all the results before contacting the office for clarification of your results.   Thank you for choosing me and [facility].  Venita Lick. Pleas Koch., MD., [facility] Graham

## 2023-04-01 NOTE — Progress Notes (Signed)
Assessment     Change in bowel habits, diarrhea, fecal incontinence, lower abdominal pain, weight loss.  Prior bowel habits - constipation with thin, frequent stools several months ago Personal history of adenomatous colon polyps with a 3-year interval surveillance colonoscopy recommended in February 2025   Recommendations    CBC, CMP, TSH, tTG, IgA, ESR, CRP Stool studies, fecal elastase, fecal calprotectin Low FODMAP diet CT AP Kegel exercises 5 times qd Imodium 1-2 tid prn  Hyoscyamine 0.125 mg 1-2 qdh prn Colonoscopy if above not diagnostic   HPI    This is a 77 year old female change in bowel habits, worsening diarrhea, fecal incontinence and lower abdominal pain.  She relates a change in bowel habits beginning last year with small frequent thin stools that were well-formed.  Beginning in February her bowel habits changed with looser stools occurring 3-4 times a day associated with intermittent fecal incontinence.  This was associated with lower abdominal pain.  She relates no recent antibiotic use or medication changes.  She has discontinued lactose products.  She drinks 1 cup of tea per day.  She cannot associate any specific foods or beverages with her symptoms.  She notes that eating less will improve her diarrhea and reports a 10 pound weight loss.  She tried taking MiraLAX for several days to try to determine if it was overflow diarrhea however there is only worsened her loose watery stools without an ongoing change in her diarrhea.  Denies abdominal pain, melena, hematochezia, nausea, vomiting, dysphagia, reflux symptoms, chest pain.  Colonoscopy Feb 2022 - One 3 mm polyp in the cecum, cold forceps biopsy removal - Three 7 to 8 mm polyps in the transverse and sending colon, cold snare removal - Moderate left colon diverticulosis - Internal hemorrhoids Path: Tubular adenomas  Labs / Imaging       Latest Ref Rng & Units 11/26/2018    4:22 AM 11/25/2018   12:20 PM  11/22/2018    3:42 PM  Hepatic Function  Total Protein 6.5 - 8.1 g/dL 5.5  6.6  6.6   Albumin 3.5 - 5.0 g/dL 2.4  3.1  3.2   AST 15 - 41 U/L 25  38  28   ALT 0 - 44 U/L 35  49  21   Alk Phosphatase 38 - 126 U/L 53  65  65   Total Bilirubin 0.3 - 1.2 mg/dL 0.2  0.3  0.7        Latest Ref Rng & Units 11/26/2018    4:22 AM 11/25/2018   12:20 PM 11/22/2018    3:42 PM  CBC  WBC 4.0 - 10.5 K/uL 7.8  6.5  9.0   Hemoglobin 12.0 - 15.0 g/dL 98.1  19.1  47.8   Hematocrit 36.0 - 46.0 % 33.4  37.2  35.1   Platelets 150 - 400 K/uL 366  344  221    Current Medications, Allergies, Past Medical History, Past Surgical History, Family History and Social History were reviewed in [employer] record.   Physical Exam: General: Well developed, well nourished, no acute distress Head: Normocephalic and atraumatic Eyes: Sclerae anicteric, EOMI Ears: Normal auditory acuity Mouth: No deformities or lesions noted Lungs: Clear throughout to auscultation Heart: Regular rate and rhythm; No murmurs, rubs or bruits Abdomen: Soft, non tender and non distended. No masses, hepatosplenomegaly or hernias noted. Normal Bowel sounds Rectal: Musculoskeletal: Symmetrical with no gross deformities  Pulses:  Normal pulses noted Extremities: No edema or  deformities noted Neurological: Alert oriented x 4, grossly nonfocal Psychological:  Alert and cooperative. Normal mood and affect   Diondre Pulis T. Russella Dar, MD 04/01/2023, 11:03 AM

## 2023-04-02 ENCOUNTER — Other Ambulatory Visit: Payer: Self-pay

## 2023-04-02 DIAGNOSIS — E876 Hypokalemia: Secondary | ICD-10-CM

## 2023-04-02 LAB — IGA: Immunoglobulin A: 217 mg/dL (ref 70–320)

## 2023-04-02 LAB — TISSUE TRANSGLUTAMINASE, IGA: (tTG) Ab, IgA: <1 U/mL

## 2023-04-06 ENCOUNTER — Other Ambulatory Visit: Payer: Medicare Other

## 2023-04-06 DIAGNOSIS — R194 Change in bowel habit: Secondary | ICD-10-CM | POA: Diagnosis not present

## 2023-04-06 DIAGNOSIS — R1032 Left lower quadrant pain: Secondary | ICD-10-CM | POA: Diagnosis not present

## 2023-04-06 DIAGNOSIS — R197 Diarrhea, unspecified: Secondary | ICD-10-CM | POA: Diagnosis not present

## 2023-04-06 DIAGNOSIS — R634 Abnormal weight loss: Secondary | ICD-10-CM | POA: Diagnosis not present

## 2023-04-09 ENCOUNTER — Other Ambulatory Visit (INDEPENDENT_AMBULATORY_CARE_PROVIDER_SITE_OTHER): Payer: Medicare Other

## 2023-04-09 ENCOUNTER — Telehealth: Payer: Self-pay | Admitting: Gastroenterology

## 2023-04-09 DIAGNOSIS — E876 Hypokalemia: Secondary | ICD-10-CM | POA: Diagnosis not present

## 2023-04-09 LAB — BASIC METABOLIC PANEL WITH GFR
BUN: 10 mg/dL (ref 6–23)
CO2: 29 meq/L (ref 19–32)
Calcium: 9.5 mg/dL (ref 8.4–10.5)
Chloride: 100 meq/L (ref 96–112)
Creatinine, Ser: 1.04 mg/dL (ref 0.40–1.20)
GFR: 52.08 mL/min — ABNORMAL LOW
Glucose, Bld: 106 mg/dL — ABNORMAL HIGH (ref 70–99)
Potassium: 3.5 meq/L (ref 3.5–5.1)
Sodium: 139 meq/L (ref 135–145)

## 2023-04-09 NOTE — Telephone Encounter (Signed)
See results note. 

## 2023-04-09 NOTE — Telephone Encounter (Signed)
Inbound call from patient, returning [name]'s call in regards to lab results.

## 2023-04-10 NOTE — Telephone Encounter (Signed)
See alternate note  

## 2023-04-10 NOTE — Telephone Encounter (Signed)
Patient is returning your call.  

## 2023-04-11 LAB — CALPROTECTIN, FECAL: Calprotectin, Fecal: 76 ug/g (ref 0–120)

## 2023-04-13 LAB — PANCREATIC ELASTASE, FECAL: Pancreatic Elastase-1, Stool: >500 mcg/g

## 2023-04-14 NOTE — Telephone Encounter (Signed)
Patient returning [facility] call

## 2023-04-14 NOTE — Telephone Encounter (Signed)
Please see alternate result note

## 2023-04-21 ENCOUNTER — Ambulatory Visit (HOSPITAL_COMMUNITY)
Admission: RE | Admit: 2023-04-21 | Discharge: 2023-04-21 | Disposition: A | Discharge status: Home / Self Care | Payer: Medicare Other | Source: Ambulatory Visit | Attending: Gastroenterology | Admitting: Gastroenterology

## 2023-04-21 ENCOUNTER — Encounter (HOSPITAL_COMMUNITY): Payer: Self-pay

## 2023-04-21 DIAGNOSIS — R109 Unspecified abdominal pain: Secondary | ICD-10-CM | POA: Diagnosis not present

## 2023-04-21 DIAGNOSIS — R197 Diarrhea, unspecified: Secondary | ICD-10-CM | POA: Diagnosis not present

## 2023-04-21 DIAGNOSIS — R1032 Left lower quadrant pain: Secondary | ICD-10-CM | POA: Diagnosis not present

## 2023-04-21 DIAGNOSIS — R194 Change in bowel habit: Secondary | ICD-10-CM

## 2023-04-21 DIAGNOSIS — R634 Abnormal weight loss: Secondary | ICD-10-CM

## 2023-04-21 DIAGNOSIS — I700 Atherosclerosis of aorta: Secondary | ICD-10-CM | POA: Diagnosis not present

## 2023-04-21 MED ORDER — SODIUM CHLORIDE (PF) 0.9 % IJ SOLN
INTRAMUSCULAR | Status: AC
  Filled 2023-04-21: qty 50

## 2023-04-21 MED ORDER — IOHEXOL 9 MG/ML PO SOLN
500.0000 mL | ORAL | Status: AC
  Administered 2023-04-21 (×2): 500 mL via ORAL

## 2023-04-21 MED ORDER — IOHEXOL 300 MG/ML  SOLN
100.0000 mL | Freq: Once | INTRAMUSCULAR | Status: AC | PRN
  Administered 2023-04-21: 100 mL via INTRAVENOUS

## 2023-04-21 MED ORDER — IOHEXOL 9 MG/ML PO SOLN
ORAL | Status: AC
  Filled 2023-04-21: qty 1000

## 2023-04-24 DIAGNOSIS — N1831 Chronic kidney disease, stage 3a: Secondary | ICD-10-CM | POA: Diagnosis not present

## 2023-04-24 DIAGNOSIS — N3289 Other specified disorders of bladder: Secondary | ICD-10-CM | POA: Diagnosis not present

## 2023-04-24 DIAGNOSIS — N183 Chronic kidney disease, stage 3 unspecified: Secondary | ICD-10-CM | POA: Diagnosis not present

## 2023-04-24 DIAGNOSIS — E876 Hypokalemia: Secondary | ICD-10-CM | POA: Diagnosis not present

## 2023-04-24 DIAGNOSIS — R197 Diarrhea, unspecified: Secondary | ICD-10-CM | POA: Diagnosis not present

## 2023-07-31 DIAGNOSIS — N183 Chronic kidney disease, stage 3 unspecified: Secondary | ICD-10-CM | POA: Diagnosis not present

## 2023-07-31 DIAGNOSIS — R5383 Other fatigue: Secondary | ICD-10-CM | POA: Diagnosis not present

## 2023-07-31 DIAGNOSIS — E039 Hypothyroidism, unspecified: Secondary | ICD-10-CM | POA: Diagnosis not present

## 2023-09-07 DIAGNOSIS — K080 Exfoliation of teeth due to systemic causes: Secondary | ICD-10-CM | POA: Diagnosis not present

## 2023-09-23 DIAGNOSIS — E039 Hypothyroidism, unspecified: Secondary | ICD-10-CM | POA: Diagnosis not present

## 2023-11-03 DIAGNOSIS — E785 Hyperlipidemia, unspecified: Secondary | ICD-10-CM | POA: Diagnosis not present

## 2023-11-03 DIAGNOSIS — E039 Hypothyroidism, unspecified: Secondary | ICD-10-CM | POA: Diagnosis not present

## 2023-11-03 DIAGNOSIS — I129 Hypertensive chronic kidney disease with stage 1 through stage 4 chronic kidney disease, or unspecified chronic kidney disease: Secondary | ICD-10-CM | POA: Diagnosis not present

## 2023-11-03 DIAGNOSIS — N183 Chronic kidney disease, stage 3 unspecified: Secondary | ICD-10-CM | POA: Diagnosis not present

## 2023-11-03 DIAGNOSIS — I700 Atherosclerosis of aorta: Secondary | ICD-10-CM | POA: Diagnosis not present

## 2023-11-04 DIAGNOSIS — Z961 Presence of intraocular lens: Secondary | ICD-10-CM | POA: Diagnosis not present

## 2024-01-13 DIAGNOSIS — E039 Hypothyroidism, unspecified: Secondary | ICD-10-CM | POA: Diagnosis not present

## 2024-02-12 DIAGNOSIS — Z1231 Encounter for screening mammogram for malignant neoplasm of breast: Secondary | ICD-10-CM | POA: Diagnosis not present

## 2024-05-03 ENCOUNTER — Other Ambulatory Visit (HOSPITAL_BASED_OUTPATIENT_CLINIC_OR_DEPARTMENT_OTHER): Payer: Self-pay | Admitting: Family Medicine

## 2024-05-03 DIAGNOSIS — I7090 Unspecified atherosclerosis: Secondary | ICD-10-CM

## 2024-05-03 DIAGNOSIS — E785 Hyperlipidemia, unspecified: Secondary | ICD-10-CM

## 2024-05-03 DIAGNOSIS — I129 Hypertensive chronic kidney disease with stage 1 through stage 4 chronic kidney disease, or unspecified chronic kidney disease: Secondary | ICD-10-CM

## 2024-05-09 DIAGNOSIS — E785 Hyperlipidemia, unspecified: Secondary | ICD-10-CM | POA: Diagnosis not present

## 2024-05-09 DIAGNOSIS — I129 Hypertensive chronic kidney disease with stage 1 through stage 4 chronic kidney disease, or unspecified chronic kidney disease: Secondary | ICD-10-CM | POA: Diagnosis not present

## 2024-05-09 DIAGNOSIS — E039 Hypothyroidism, unspecified: Secondary | ICD-10-CM | POA: Diagnosis not present

## 2024-05-09 DIAGNOSIS — N183 Chronic kidney disease, stage 3 unspecified: Secondary | ICD-10-CM | POA: Diagnosis not present

## 2024-05-13 ENCOUNTER — Ambulatory Visit (HOSPITAL_COMMUNITY)
Admission: RE | Admit: 2024-05-13 | Discharge: 2024-05-13 | Disposition: A | Discharge status: Home / Self Care | Payer: Self-pay | Source: Ambulatory Visit | Attending: Family Medicine | Admitting: Family Medicine

## 2024-05-13 DIAGNOSIS — I129 Hypertensive chronic kidney disease with stage 1 through stage 4 chronic kidney disease, or unspecified chronic kidney disease: Secondary | ICD-10-CM

## 2024-05-13 DIAGNOSIS — E785 Hyperlipidemia, unspecified: Secondary | ICD-10-CM

## 2024-05-13 DIAGNOSIS — I7090 Unspecified atherosclerosis: Secondary | ICD-10-CM

## 2024-05-18 DIAGNOSIS — K080 Exfoliation of teeth due to systemic causes: Secondary | ICD-10-CM | POA: Diagnosis not present

## 2024-05-26 DIAGNOSIS — H524 Presbyopia: Secondary | ICD-10-CM | POA: Diagnosis not present

## 2024-06-11 DIAGNOSIS — R079 Chest pain, unspecified: Secondary | ICD-10-CM | POA: Diagnosis not present

## 2024-06-11 DIAGNOSIS — Z79899 Other long term (current) drug therapy: Secondary | ICD-10-CM | POA: Diagnosis not present

## 2024-06-11 DIAGNOSIS — I4891 Unspecified atrial fibrillation: Secondary | ICD-10-CM | POA: Diagnosis not present

## 2024-06-11 DIAGNOSIS — R0789 Other chest pain: Secondary | ICD-10-CM | POA: Diagnosis not present

## 2024-06-11 DIAGNOSIS — I10 Essential (primary) hypertension: Secondary | ICD-10-CM | POA: Diagnosis not present

## 2024-06-11 DIAGNOSIS — R0602 Shortness of breath: Secondary | ICD-10-CM | POA: Diagnosis not present

## 2024-06-11 DIAGNOSIS — I081 Rheumatic disorders of both mitral and tricuspid valves: Secondary | ICD-10-CM | POA: Diagnosis not present

## 2024-06-11 DIAGNOSIS — R42 Dizziness and giddiness: Secondary | ICD-10-CM | POA: Diagnosis not present

## 2024-06-11 DIAGNOSIS — I4719 Other supraventricular tachycardia: Secondary | ICD-10-CM | POA: Diagnosis not present

## 2024-06-11 DIAGNOSIS — K7689 Other specified diseases of liver: Secondary | ICD-10-CM | POA: Diagnosis not present

## 2024-06-11 DIAGNOSIS — E876 Hypokalemia: Secondary | ICD-10-CM | POA: Diagnosis not present

## 2024-06-11 DIAGNOSIS — I440 Atrioventricular block, first degree: Secondary | ICD-10-CM | POA: Diagnosis not present

## 2024-06-11 DIAGNOSIS — E038 Other specified hypothyroidism: Secondary | ICD-10-CM | POA: Diagnosis not present

## 2024-06-11 DIAGNOSIS — F419 Anxiety disorder, unspecified: Secondary | ICD-10-CM | POA: Diagnosis not present

## 2024-06-11 DIAGNOSIS — I471 Supraventricular tachycardia, unspecified: Secondary | ICD-10-CM | POA: Diagnosis not present

## 2024-06-11 DIAGNOSIS — F32A Depression, unspecified: Secondary | ICD-10-CM | POA: Diagnosis not present

## 2024-06-11 DIAGNOSIS — R55 Syncope and collapse: Secondary | ICD-10-CM | POA: Diagnosis not present

## 2024-06-11 DIAGNOSIS — I499 Cardiac arrhythmia, unspecified: Secondary | ICD-10-CM | POA: Diagnosis not present

## 2024-06-12 DIAGNOSIS — I471 Supraventricular tachycardia, unspecified: Secondary | ICD-10-CM | POA: Diagnosis not present

## 2024-06-30 DIAGNOSIS — R5383 Other fatigue: Secondary | ICD-10-CM | POA: Diagnosis not present

## 2024-06-30 DIAGNOSIS — E0590 Thyrotoxicosis, unspecified without thyrotoxic crisis or storm: Secondary | ICD-10-CM | POA: Diagnosis not present

## 2024-06-30 DIAGNOSIS — R5381 Other malaise: Secondary | ICD-10-CM | POA: Diagnosis not present

## 2024-06-30 DIAGNOSIS — I4719 Other supraventricular tachycardia: Secondary | ICD-10-CM | POA: Diagnosis not present

## 2024-07-03 DIAGNOSIS — I440 Atrioventricular block, first degree: Secondary | ICD-10-CM | POA: Diagnosis not present

## 2024-07-03 DIAGNOSIS — R0789 Other chest pain: Secondary | ICD-10-CM | POA: Diagnosis not present

## 2024-07-03 DIAGNOSIS — R0602 Shortness of breath: Secondary | ICD-10-CM | POA: Diagnosis not present

## 2024-07-03 DIAGNOSIS — E871 Hypo-osmolality and hyponatremia: Secondary | ICD-10-CM | POA: Diagnosis not present

## 2024-07-03 DIAGNOSIS — R002 Palpitations: Secondary | ICD-10-CM | POA: Diagnosis not present

## 2024-07-03 DIAGNOSIS — R42 Dizziness and giddiness: Secondary | ICD-10-CM | POA: Diagnosis not present

## 2024-07-03 DIAGNOSIS — R55 Syncope and collapse: Secondary | ICD-10-CM | POA: Diagnosis not present

## 2024-07-04 DIAGNOSIS — I4891 Unspecified atrial fibrillation: Secondary | ICD-10-CM | POA: Diagnosis not present

## 2024-07-04 DIAGNOSIS — I440 Atrioventricular block, first degree: Secondary | ICD-10-CM | POA: Diagnosis not present

## 2024-07-05 DIAGNOSIS — I4719 Other supraventricular tachycardia: Secondary | ICD-10-CM | POA: Diagnosis not present

## 2024-07-05 DIAGNOSIS — I4891 Unspecified atrial fibrillation: Secondary | ICD-10-CM | POA: Diagnosis not present

## 2024-07-14 DIAGNOSIS — R5383 Other fatigue: Secondary | ICD-10-CM | POA: Diagnosis not present

## 2024-07-14 DIAGNOSIS — I10 Essential (primary) hypertension: Secondary | ICD-10-CM | POA: Diagnosis not present

## 2024-07-14 DIAGNOSIS — R5381 Other malaise: Secondary | ICD-10-CM | POA: Diagnosis not present

## 2024-07-21 DIAGNOSIS — I10 Essential (primary) hypertension: Secondary | ICD-10-CM | POA: Diagnosis not present

## 2024-07-21 DIAGNOSIS — I4719 Other supraventricular tachycardia: Secondary | ICD-10-CM | POA: Diagnosis not present

## 2024-08-09 DIAGNOSIS — I4719 Other supraventricular tachycardia: Secondary | ICD-10-CM | POA: Diagnosis not present

## 2024-08-09 DIAGNOSIS — R5381 Other malaise: Secondary | ICD-10-CM | POA: Diagnosis not present

## 2024-08-09 DIAGNOSIS — E039 Hypothyroidism, unspecified: Secondary | ICD-10-CM | POA: Diagnosis not present

## 2024-08-09 DIAGNOSIS — I10 Essential (primary) hypertension: Secondary | ICD-10-CM | POA: Diagnosis not present

## 2024-08-10 DIAGNOSIS — I493 Ventricular premature depolarization: Secondary | ICD-10-CM | POA: Diagnosis not present

## 2024-08-10 DIAGNOSIS — I440 Atrioventricular block, first degree: Secondary | ICD-10-CM | POA: Diagnosis not present

## 2024-08-23 DIAGNOSIS — I4719 Other supraventricular tachycardia: Secondary | ICD-10-CM | POA: Diagnosis not present

## 2024-08-23 DIAGNOSIS — E039 Hypothyroidism, unspecified: Secondary | ICD-10-CM | POA: Diagnosis not present

## 2024-08-23 DIAGNOSIS — R5381 Other malaise: Secondary | ICD-10-CM | POA: Diagnosis not present

## 2024-08-23 DIAGNOSIS — R5383 Other fatigue: Secondary | ICD-10-CM | POA: Diagnosis not present

## 2024-08-23 DIAGNOSIS — I10 Essential (primary) hypertension: Secondary | ICD-10-CM | POA: Diagnosis not present

## 2024-09-19 DIAGNOSIS — R5383 Other fatigue: Secondary | ICD-10-CM | POA: Diagnosis not present

## 2024-09-19 DIAGNOSIS — I10 Essential (primary) hypertension: Secondary | ICD-10-CM | POA: Diagnosis not present

## 2024-09-19 DIAGNOSIS — R5381 Other malaise: Secondary | ICD-10-CM | POA: Diagnosis not present

## 2024-09-19 DIAGNOSIS — Z882 Allergy status to sulfonamides status: Secondary | ICD-10-CM | POA: Diagnosis not present

## 2024-09-19 DIAGNOSIS — Z881 Allergy status to other antibiotic agents status: Secondary | ICD-10-CM | POA: Diagnosis not present

## 2024-09-19 DIAGNOSIS — I4719 Other supraventricular tachycardia: Secondary | ICD-10-CM | POA: Diagnosis not present

## 2024-09-19 DIAGNOSIS — G43109 Migraine with aura, not intractable, without status migrainosus: Secondary | ICD-10-CM | POA: Diagnosis not present

## 2024-09-19 DIAGNOSIS — Z888 Allergy status to other drugs, medicaments and biological substances status: Secondary | ICD-10-CM | POA: Diagnosis not present

## 2024-09-19 DIAGNOSIS — E039 Hypothyroidism, unspecified: Secondary | ICD-10-CM | POA: Diagnosis not present

## 2024-09-20 DIAGNOSIS — Z881 Allergy status to other antibiotic agents status: Secondary | ICD-10-CM | POA: Diagnosis not present

## 2024-09-20 DIAGNOSIS — E039 Hypothyroidism, unspecified: Secondary | ICD-10-CM | POA: Diagnosis not present

## 2024-09-20 DIAGNOSIS — I10 Essential (primary) hypertension: Secondary | ICD-10-CM | POA: Diagnosis not present

## 2024-09-20 DIAGNOSIS — R5381 Other malaise: Secondary | ICD-10-CM | POA: Diagnosis not present

## 2024-09-20 DIAGNOSIS — R5383 Other fatigue: Secondary | ICD-10-CM | POA: Diagnosis not present

## 2024-09-20 DIAGNOSIS — Z888 Allergy status to other drugs, medicaments and biological substances status: Secondary | ICD-10-CM | POA: Diagnosis not present

## 2024-09-20 DIAGNOSIS — I4719 Other supraventricular tachycardia: Secondary | ICD-10-CM | POA: Diagnosis not present

## 2024-09-20 DIAGNOSIS — G43109 Migraine with aura, not intractable, without status migrainosus: Secondary | ICD-10-CM | POA: Diagnosis not present

## 2024-09-20 DIAGNOSIS — Z882 Allergy status to sulfonamides status: Secondary | ICD-10-CM | POA: Diagnosis not present

## 2024-09-30 DIAGNOSIS — I4719 Other supraventricular tachycardia: Secondary | ICD-10-CM | POA: Diagnosis not present

## 2024-09-30 DIAGNOSIS — F419 Anxiety disorder, unspecified: Secondary | ICD-10-CM | POA: Diagnosis not present

## 2024-09-30 DIAGNOSIS — M545 Low back pain, unspecified: Secondary | ICD-10-CM | POA: Diagnosis not present

## 2024-09-30 DIAGNOSIS — E785 Hyperlipidemia, unspecified: Secondary | ICD-10-CM | POA: Diagnosis not present

## 2024-09-30 DIAGNOSIS — R8290 Unspecified abnormal findings in urine: Secondary | ICD-10-CM | POA: Diagnosis not present

## 2024-10-21 DIAGNOSIS — R002 Palpitations: Secondary | ICD-10-CM | POA: Diagnosis not present

## 2024-10-21 DIAGNOSIS — I361 Nonrheumatic tricuspid (valve) insufficiency: Secondary | ICD-10-CM | POA: Diagnosis not present

## 2024-10-21 DIAGNOSIS — R001 Bradycardia, unspecified: Secondary | ICD-10-CM | POA: Diagnosis not present

## 2024-10-24 DIAGNOSIS — I129 Hypertensive chronic kidney disease with stage 1 through stage 4 chronic kidney disease, or unspecified chronic kidney disease: Secondary | ICD-10-CM | POA: Diagnosis not present

## 2024-10-24 DIAGNOSIS — Z0000 Encounter for general adult medical examination without abnormal findings: Secondary | ICD-10-CM | POA: Diagnosis not present

## 2024-10-24 DIAGNOSIS — M810 Age-related osteoporosis without current pathological fracture: Secondary | ICD-10-CM | POA: Diagnosis not present

## 2024-10-24 DIAGNOSIS — E785 Hyperlipidemia, unspecified: Secondary | ICD-10-CM | POA: Diagnosis not present

## 2024-10-24 DIAGNOSIS — N183 Chronic kidney disease, stage 3 unspecified: Secondary | ICD-10-CM | POA: Diagnosis not present

## 2024-10-24 DIAGNOSIS — E039 Hypothyroidism, unspecified: Secondary | ICD-10-CM | POA: Diagnosis not present

## 2024-10-24 DIAGNOSIS — I4719 Other supraventricular tachycardia: Secondary | ICD-10-CM | POA: Diagnosis not present

## 2024-11-01 DIAGNOSIS — R002 Palpitations: Secondary | ICD-10-CM | POA: Diagnosis not present

## 2024-11-28 DIAGNOSIS — K080 Exfoliation of teeth due to systemic causes: Secondary | ICD-10-CM | POA: Diagnosis not present
